# Patient Record
Sex: Male | Born: 1955
Health system: Southern US, Community
[De-identification: ages and names within clinical notes are randomized; demographics above are authoritative.]

## PROBLEM LIST (undated history)

## (undated) ENCOUNTER — Emergency Department (HOSPITAL_COMMUNITY): Admission: EM | Discharge status: Absent without leave | Payer: Self-pay

## (undated) DIAGNOSIS — I10 Essential (primary) hypertension: Secondary | ICD-10-CM

## (undated) DIAGNOSIS — E785 Hyperlipidemia, unspecified: Secondary | ICD-10-CM

## (undated) DIAGNOSIS — E119 Type 2 diabetes mellitus without complications: Secondary | ICD-10-CM

## (undated) DIAGNOSIS — Z9109 Other allergy status, other than to drugs and biological substances: Secondary | ICD-10-CM

## (undated) HISTORY — DX: Type 2 diabetes mellitus without complications: E11.9

## (undated) HISTORY — PX: EYE SURGERY: SHX253

## (undated) HISTORY — DX: Hyperlipidemia, unspecified: E78.5

---

## 2005-07-02 ENCOUNTER — Encounter: Admission: RE | Admit: 2005-07-02 | Discharge: 2005-09-30 | Payer: Self-pay | Admitting: "Endocrinology

## 2007-11-19 ENCOUNTER — Encounter: Admission: RE | Admit: 2007-11-19 | Discharge: 2007-11-19 | Payer: Self-pay | Admitting: Internal Medicine

## 2010-03-08 ENCOUNTER — Encounter: Payer: Self-pay | Admitting: Family Medicine

## 2010-03-08 ENCOUNTER — Emergency Department (HOSPITAL_COMMUNITY): Admission: EM | Admit: 2010-03-08 | Discharge: 2010-03-08 | Payer: Self-pay | Admitting: Emergency Medicine

## 2010-03-08 DIAGNOSIS — H401190 Primary open-angle glaucoma, unspecified eye, stage unspecified: Secondary | ICD-10-CM

## 2010-03-24 ENCOUNTER — Encounter: Payer: Self-pay | Admitting: *Deleted

## 2010-04-08 ENCOUNTER — Ambulatory Visit: Payer: Self-pay | Admitting: Family Medicine

## 2010-04-08 ENCOUNTER — Encounter (INDEPENDENT_AMBULATORY_CARE_PROVIDER_SITE_OTHER): Payer: Self-pay | Admitting: Family Medicine

## 2010-04-08 LAB — CONVERTED CEMR LAB
ALT: 29 units/L (ref 0–53)
Albumin: 4.1 g/dL (ref 3.5–5.2)
Basophils Relative: 0 % (ref 0–1)
CO2: 24 meq/L (ref 19–32)
Cholesterol: 197 mg/dL (ref 0–200)
Glucose, Bld: 116 mg/dL — ABNORMAL HIGH (ref 70–99)
Hemoglobin: 14.6 g/dL (ref 13.0–17.0)
LDL Cholesterol: 131 mg/dL — ABNORMAL HIGH (ref 0–99)
Lymphocytes Relative: 56 % — ABNORMAL HIGH (ref 12–46)
Lymphs Abs: 2.4 10*3/uL (ref 0.7–4.0)
MCHC: 34 g/dL (ref 30.0–36.0)
Monocytes Absolute: 0.4 10*3/uL (ref 0.1–1.0)
Monocytes Relative: 9 % (ref 3–12)
Neutro Abs: 1.4 10*3/uL — ABNORMAL LOW (ref 1.7–7.7)
Neutrophils Relative %: 33 % — ABNORMAL LOW (ref 43–77)
Potassium: 4.3 meq/L (ref 3.5–5.3)
RBC: 4.89 M/uL (ref 4.22–5.81)
Sodium: 139 meq/L (ref 135–145)
Total Bilirubin: 0.5 mg/dL (ref 0.3–1.2)
Total Protein: 6.8 g/dL (ref 6.0–8.3)
VLDL: 22 mg/dL (ref 0–40)
WBC: 4.3 10*3/uL (ref 4.0–10.5)

## 2010-04-23 ENCOUNTER — Ambulatory Visit: Payer: Self-pay | Admitting: Family Medicine

## 2010-06-12 ENCOUNTER — Ambulatory Visit: Payer: Self-pay | Admitting: Internal Medicine

## 2010-07-17 ENCOUNTER — Ambulatory Visit: Payer: Self-pay | Admitting: Internal Medicine

## 2010-07-17 ENCOUNTER — Encounter (INDEPENDENT_AMBULATORY_CARE_PROVIDER_SITE_OTHER): Payer: Self-pay | Admitting: Family Medicine

## 2010-07-17 LAB — CONVERTED CEMR LAB
Albumin: 4.2 g/dL (ref 3.5–5.2)
BUN: 15 mg/dL (ref 6–23)
Calcium: 9.1 mg/dL (ref 8.4–10.5)
Chloride: 104 meq/L (ref 96–112)
Cholesterol: 196 mg/dL (ref 0–200)
Creatinine, Ser: 0.89 mg/dL (ref 0.40–1.50)
Glucose, Bld: 111 mg/dL — ABNORMAL HIGH (ref 70–99)
HDL: 42 mg/dL (ref >39)
Hemoglobin: 14.8 g/dL (ref 13.0–17.0)
LDL Cholesterol: 130 mg/dL — ABNORMAL HIGH (ref 0–99)
Lymphs Abs: 2.3 10*3/uL (ref 0.7–4.0)
MCV: 89.8 fL (ref 78.0–100.0)
Monocytes Absolute: 0.4 10*3/uL (ref 0.1–1.0)
Monocytes Relative: 9 % (ref 3–12)
Neutro Abs: 1.4 10*3/uL — ABNORMAL LOW (ref 1.7–7.7)
Neutrophils Relative %: 34 % — ABNORMAL LOW (ref 43–77)
Potassium: 4.2 meq/L (ref 3.5–5.3)
RBC: 4.9 M/uL (ref 4.22–5.81)
Triglycerides: 121 mg/dL (ref <150)
WBC: 4.2 10*3/uL (ref 4.0–10.5)

## 2010-12-04 NOTE — Miscellaneous (Signed)
Summary: Health History Form  Clinical Lists Changes  Problems: Added new problem of GLAUCOMA, PRIMARY OPEN-ANGLE (ICD-365.11) Medications: Added new medication of TRAVATAN Z 0.004 % SOLN (TRAVOPROST) Once daily Observations: Added new observation of FAMILY HX: Negative for:  heart disease, cancer, diabetes, stroke, sudden deaty. (03/08/2010 16:13) Added new observation of SOCIAL HX: Lives alone in [place].  Married.  Postgraduate education.  Runs for fun.  Never smoked.  No EtOH/illicit drugs.  Exercises twice weekly:  walking, exercise machine. (03/08/2010 16:13) Added new observation of PAST SURG HX: Negative. (03/08/2010 16:13) Added new observation of PAST MED HX: Negative. (03/08/2010 16:13)      Past Medical History:    Negative.   Past History:  Past Medical History: Negative.  Past Surgical History: Negative.   Family History: Negative for:  heart disease, cancer, diabetes, stroke, sudden deaty.  Social History: Lives alone in [place].  Married.  Postgraduate education.  Runs for fun.  Never smoked.  No EtOH/illicit drugs.  Exercises twice weekly:  walking, exercise machine.

## 2010-12-04 NOTE — Miscellaneous (Signed)
Summary: Do Not Reschedule  Missed NP appt.  Per [facility] policy is not allowed to reschedule.  Dennison Nancy RN  Mar 24, 2010 1:53 PM

## 2012-03-01 ENCOUNTER — Encounter (HOSPITAL_COMMUNITY): Payer: Self-pay | Admitting: Emergency Medicine

## 2012-03-01 ENCOUNTER — Emergency Department (INDEPENDENT_AMBULATORY_CARE_PROVIDER_SITE_OTHER)
Admission: EM | Admit: 2012-03-01 | Discharge: 2012-03-01 | Disposition: A | Discharge status: Home / Self Care | Payer: Self-pay | Source: Home / Self Care | Attending: Emergency Medicine | Admitting: Emergency Medicine

## 2012-03-01 DIAGNOSIS — J309 Allergic rhinitis, unspecified: Secondary | ICD-10-CM

## 2012-03-01 HISTORY — DX: Other allergy status, other than to drugs and biological substances: Z91.09

## 2012-03-01 MED ORDER — FLUTICASONE PROPIONATE 50 MCG/ACT NA SUSP: 2.0000 | Freq: Every day | NASAL | Status: DC

## 2012-03-01 MED ORDER — AMOXICILLIN 500 MG PO CAPS: 1000.0000 mg | ORAL_CAPSULE | Freq: Three times a day (TID) | ORAL | Status: AC

## 2012-03-01 MED ORDER — GUAIFENESIN-CODEINE 100-10 MG/5ML PO SYRP: 10.0000 mL | ORAL_SOLUTION | Freq: Four times a day (QID) | ORAL | Status: AC | PRN

## 2012-03-01 MED ORDER — METHYLPREDNISOLONE ACETATE 80 MG/ML IJ SUSP
80.0000 mg | Freq: Once | INTRAMUSCULAR | Status: AC
  Administered 2012-03-01: 80 mg via INTRAMUSCULAR

## 2012-03-01 MED ORDER — CHLORPHENIRAMINE MALEATE 12 MG PO CP24: 1.0000 | ORAL_CAPSULE | Freq: Every day | ORAL | Status: DC

## 2012-03-01 MED ORDER — PREDNISONE 10 MG PO TABS: ORAL_TABLET | ORAL | Status: DC

## 2012-03-01 MED ORDER — ALBUTEROL SULFATE HFA 108 (90 BASE) MCG/ACT IN AERS: 1.0000 | INHALATION_SPRAY | Freq: Four times a day (QID) | RESPIRATORY_TRACT | Status: DC | PRN

## 2012-03-01 MED ORDER — METHYLPREDNISOLONE ACETATE 80 MG/ML IJ SUSP
INTRAMUSCULAR | Status: AC
  Filled 2012-03-01: qty 1

## 2012-03-01 NOTE — ED Provider Notes (Signed)
Chief Complaint  Patient presents with  . Allergies  . Cough    History of Present Illness:   The patient is a 56 year old male from [place] who has had a three-day history of cough productive of small amounts of yellow sputum, tightness in chest, chest congestion, sore throat, nasal congestion, sneezing, clear rhinorrhea, itchy, watery eyes. He denies any fever or chills. He has a history of allergies or sensitivities this country from [place]. He has been given benzonatate and Nasonex without any improvement. He denies any history of asthma.  Review of Systems:  Other than noted above, the patient denies any of the following symptoms. Systemic:  No fever, chills, sweats, fatigue, myalgias, headache, or anorexia. Eye:  No redness, itching, watering, pain or drainage. ENT:  No earache, ear congestion, nasal congestion, sneezing, nasal itching, rhinorrhea, sinus pressure, sinus pain, post nasal drip, or sore throat. Lungs:  No cough, sputum production, wheezing, shortness of breath, or chest pain. Skin:  No rash or itching.  PMFSH:  Past medical history, family history, social history, meds, and allergies were reviewed.  No history of asthma. No use of tobacco.  Physical Exam:   Vital signs:  BP 147/78  Pulse 100  Temp(Src) 97.9 F (36.6 C) (Oral)  Resp 20  SpO2 98% General:  Alert, in no distress. Eye:  No conjunctival injection or drainage. Lids were normal. ENT:  TMs and canals were normal, without erythema or inflammation.  Nasal mucosa was not congested with no drainage.  Mucous membranes were moist.  Pharynx was clear, without exudate or drainage.  There were no oral ulcerations or lesions. Neck:  Supple, no adenopathy, tenderness or mass. Lungs:  No respiratory distress.  Lungs were clear to auscultation, without wheezes, rales or rhonchi.  Breath sounds were clear and equal bilaterally. Heart:  Regular rhythm, without gallops, murmers or rubs. Skin:  Clear, warm, and dry, without  rash or lesions.  Course in Urgent Care Center:   He was given Depo-Medrol 80 mg IM and tolerated this well without any immediate side effects.  Assessment:  The encounter diagnosis was Allergic rhinitis.  Plan:   1.  The following meds were prescribed:   New Prescriptions   ALBUTEROL (PROVENTIL HFA;VENTOLIN HFA) 108 (90 BASE) MCG/ACT INHALER    Inhale 1-2 puffs into the lungs every 6 (six) hours as needed for wheezing.   AMOXICILLIN (AMOXIL) 500 MG CAPSULE    Take 2 capsules (1,000 mg total) by mouth 3 (three) times daily.   CHLORPHENIRAMINE MALEATE 12 MG CP24    Take 1 capsule (12 mg total) by mouth daily.   FLUTICASONE (FLONASE) 50 MCG/ACT NASAL SPRAY    Place 2 sprays into the nose daily.   GUAIFENESIN-CODEINE (GUIATUSS AC) 100-10 MG/5ML SYRUP    Take 10 mLs by mouth 4 (four) times daily as needed for cough.   PREDNISONE (DELTASONE) 10 MG TABLET    Take 4 tabs daily for 4 days, 3 tabs daily for 4 days, 2 tabs daily for 4 days, then 1 tab daily for 4 days.   2.  The patient was instructed in symptomatic care and handouts were given. 3.  The patient was told to return if becoming worse in any way, if no better in 3 or 4 days, and given some red flag symptoms that would indicate earlier return.  Follow up:  The patient was told to follow up with his primary care physician as [facility].    Reuben Likes, MD  03/01/12 1909 

## 2012-03-01 NOTE — Discharge Instructions (Signed)
Allergic Rhinitis  Allergic rhinitis is when the mucous membranes in the nose respond to allergens. Allergens are particles in the air that cause your body to have an allergic reaction. This causes you to release allergic antibodies. Through a chain of events, these eventually cause you to release histamine into the blood stream (hence the use of antihistamines). Although meant to be protective to the body, it is this release that causes your discomfort, such as frequent sneezing, congestion and an itchy runny nose.    CAUSES    The pollen allergens may come from grasses, trees, and weeds. This is seasonal allergic rhinitis, or "hay fever." Other allergens cause year-round allergic rhinitis (perennial allergic rhinitis) such as house dust mite allergen, pet dander and mold spores.    SYMPTOMS     Nasal stuffiness (congestion).   Runny, itchy nose with sneezing and tearing of the eyes.   There is often an itching of the mouth, eyes and ears.  It cannot be cured, but it can be controlled with medications.  DIAGNOSIS    If you are unable to determine the offending allergen, skin or blood testing may find it.  TREATMENT     Avoid the allergen.   Medications and allergy shots (immunotherapy) can help.   Hay fever may often be treated with antihistamines in pill or nasal spray forms. Antihistamines block the effects of histamine. There are over-the-counter medicines that may help with nasal congestion and swelling around the eyes. Check with your caregiver before taking or giving this medicine.  If the treatment above does not work, there are many new medications your caregiver can prescribe. Stronger medications may be used if initial measures are ineffective. Desensitizing injections can be used if medications and avoidance fails. Desensitization is when a patient is given ongoing shots until the body becomes less sensitive to the allergen. Make sure you follow up with your caregiver if problems continue.  SEEK  MEDICAL CARE IF:     You develop fever (more than 100.5 F (38.1 C).   You develop a cough that does not stop easily (persistent).   You have shortness of breath.   You start wheezing.   Symptoms interfere with normal daily activities.  Document Released: 07/15/2001 Document Revised: 10/09/2011 Document Reviewed: 01/24/2009  [employer] Patient Information 2012 [employer].

## 2012-03-01 NOTE — ED Notes (Signed)
PT HERE WITH ALLERGY SX THAT STARTED X 3 DYS AGO AFTER CUTTING GRASS.SX SORE THROAT,AND CONSTANT DRY COUGH NOTED THAT WORSENS AT NIGHT.PT IS TAKING OLD BENZONATATE AND NASONEX FOR SX BUT NO RELIEF.DENIES FEVER,CHILLS,N,V,D

## 2012-04-30 ENCOUNTER — Emergency Department (INDEPENDENT_AMBULATORY_CARE_PROVIDER_SITE_OTHER)
Admission: EM | Admit: 2012-04-30 | Discharge: 2012-04-30 | Disposition: A | Discharge status: Home / Self Care | Payer: Self-pay | Source: Home / Self Care | Attending: Family Medicine | Admitting: Family Medicine

## 2012-04-30 ENCOUNTER — Encounter (HOSPITAL_COMMUNITY): Payer: Self-pay

## 2012-04-30 DIAGNOSIS — M778 Other enthesopathies, not elsewhere classified: Secondary | ICD-10-CM

## 2012-04-30 DIAGNOSIS — M6580 Other synovitis and tenosynovitis, unspecified site: Secondary | ICD-10-CM

## 2012-04-30 MED ORDER — NAPROXEN 500 MG PO TABS: 500.0000 mg | ORAL_TABLET | Freq: Two times a day (BID) | ORAL | Status: DC

## 2012-04-30 NOTE — ED Provider Notes (Signed)
History     CSN: [id number]  Arrival date & time 04/30/12  1610   First MD Initiated Contact with Patient 04/30/12 1800      Chief Complaint  Patient presents with  . Arm Pain    (Consider location/radiation/quality/duration/timing/severity/associated sxs/prior treatment) HPI Comments: Pt denies injury, c/o pain L elbow with use of arm. States it hurts when he tries to pick things up, even light things and therefore it feels "weak"  Patient is a 56 y.o. male presenting with arm pain. The history is provided by the patient.  Arm Pain This is a new problem. Episode onset: a week ago. The problem occurs constantly. The problem has not changed since onset.The symptoms are aggravated by exertion. Nothing relieves the symptoms. He has tried rest for the symptoms. The treatment provided no relief.    Past Medical History  Diagnosis Date  . Pollen allergies     History reviewed. No pertinent past surgical history.  History reviewed. No pertinent family history.  History  Substance Use Topics  . Smoking status: Never Smoker   . Smokeless tobacco: Not on file  . Alcohol Use: No      Review of Systems  Constitutional: Negative for fever and chills.  Musculoskeletal:       Elbow pain  Skin: Negative for color change, rash and wound.  Neurological: Positive for weakness. Negative for numbness.    Allergies  Review of patient's allergies indicates no known allergies.  Home Medications   Current Outpatient Rx  Name Route Sig Dispense Refill  . ALBUTEROL SULFATE HFA 108 (90 BASE) MCG/ACT IN AERS Inhalation Inhale 1-2 puffs into the lungs every 6 (six) hours as needed for wheezing. 1 Inhaler 0  . CETIRIZINE HCL 10 MG PO TABS Oral Take 10 mg by mouth daily.    . CHLORPHENIRAMINE MALEATE ER 12 MG PO CP24 Oral Take 1 capsule (12 mg total) by mouth daily. 15 each 3  . FLUTICASONE PROPIONATE 50 MCG/ACT NA SUSP Nasal Place 2 sprays into the nose daily. 16 g 0  . NAPROXEN 500 MG PO  TABS Oral Take 1 tablet (500 mg total) by mouth 2 (two) times daily with a meal. 20 tablet 0  . PREDNISONE 10 MG PO TABS  Take 4 tabs daily for 4 days, 3 tabs daily for 4 days, 2 tabs daily for 4 days, then 1 tab daily for 4 days. 40 tablet 0    BP 142/86  Pulse 95  Temp 98.1 F (36.7 C) (Oral)  Resp 18  SpO2 97%  Physical Exam  Constitutional: He appears well-developed and well-nourished. No distress.  Pulmonary/Chest: Effort normal.  Musculoskeletal:       Left elbow: He exhibits normal range of motion, no swelling and no effusion. tenderness found.  Neurological: He has normal strength. No sensory deficit.       Strength normal BUE, elbow; sensation in BUE grossly intact.    Skin: Skin is warm, dry and intact. No rash noted.    ED Course  Procedures (including critical care time)  Labs Reviewed - No data to display No results found.   1. Tendonitis of elbow, left       MDM          Cathlyn Parsons, NP 04/30/12 1830

## 2012-04-30 NOTE — ED Notes (Signed)
Denies injury; c/o pain in left elbow, hard to hold anything; denies injury; has not taken anything for the pain

## 2012-04-30 NOTE — Discharge Instructions (Signed)
Tendinitis Tendinitis is swelling and inflammation of the tendons. Tendons are band-like tissues that connect muscle to bone. Tendinitis commonly occurs in the:   Shoulders (rotator cuff).   Heels (Achilles tendon).   Elbows (triceps tendon).  CAUSES Tendinitis is usually caused by overusing the tendon, muscles, and joints involved. When the tissue surrounding a tendon (synovium) becomes inflamed, it is called tenosynovitis. Tendinitis commonly develops in people whose jobs require repetitive motions. SYMPTOMS  Pain.   Tenderness.   Mild swelling.  DIAGNOSIS Tendinitis is usually diagnosed by physical exam. Your caregiver may also order X-rays or other imaging tests. TREATMENT Your caregiver may recommend certain medicines or exercises for your treatment. HOME CARE INSTRUCTIONS   Use a sling or splint for as long as directed by your caregiver until the pain decreases.   Put ice on the injured area.   Put ice in a plastic bag.   Place a towel between your skin and the bag.   Leave the ice on for 15 to 20 minutes, 3 to 4 times a day.   Avoid using the limb while the tendon is painful. Perform gentle range of motion exercises only as directed by your caregiver. Stop exercises if pain or discomfort increase, unless directed otherwise by your caregiver.   Only take over-the-counter or prescription medicines for pain, discomfort, or fever as directed by your caregiver.  SEEK MEDICAL CARE IF:   Your pain and swelling increase.   You develop new, unexplained symptoms, especially increased numbness in the hands.  MAKE SURE YOU:   Understand these instructions.   Will watch your condition.   Will get help right away if you are not doing well or get worse.  Document Released: 10/17/2000 Document Revised: 10/09/2011 Document Reviewed: 01/06/2011 [facility] Patient Information 2012 [place], [place].Tendinitis Tendinitis is swelling and inflammation of the tendons. Tendons are  band-like tissues that connect muscle to bone. Tendinitis commonly occurs in the:   Shoulders (rotator cuff).   Heels (Achilles tendon).   Elbows (triceps tendon).  CAUSES Tendinitis is usually caused by overusing the tendon, muscles, and joints involved. When the tissue surrounding a tendon (synovium) becomes inflamed, it is called tenosynovitis. Tendinitis commonly develops in people whose jobs require repetitive motions. SYMPTOMS  Pain.   Tenderness.   Mild swelling.  DIAGNOSIS Tendinitis is usually diagnosed by physical exam. Your caregiver may also order X-rays or other imaging tests. TREATMENT Your caregiver may recommend certain medicines or exercises for your treatment. HOME CARE INSTRUCTIONS   Use a sling or splint for as long as directed by your caregiver until the pain decreases.   Put ice on the injured area.   Put ice in a plastic bag.   Place a towel between your skin and the bag.   Leave the ice on for 15 to 20 minutes, 3 to 4 times a day.   Avoid using the limb while the tendon is painful. Perform gentle range of motion exercises only as directed by your caregiver. Stop exercises if pain or discomfort increase, unless directed otherwise by your caregiver.   Only take over-the-counter or prescription medicines for pain, discomfort, or fever as directed by your caregiver.  SEEK MEDICAL CARE IF:   Your pain and swelling increase.   You develop new, unexplained symptoms, especially increased numbness in the hands.  MAKE SURE YOU:   Understand these instructions.   Will watch your condition.   Will get help right away if you are not doing well or get worse.  Document Released: 10/17/2000 Document Revised: 10/09/2011 Document Reviewed: 01/06/2011 [facility] Patient Information 2012 [place], [place].

## 2012-05-01 NOTE — ED Provider Notes (Signed)
Medical screening examination/treatment/procedure(s) were performed by non-physician practitioner and as supervising physician I was immediately available for consultation/collaboration.   [facility]; MD   Sharin Grave, MD 05/01/12 1255

## 2012-05-21 ENCOUNTER — Emergency Department (INDEPENDENT_AMBULATORY_CARE_PROVIDER_SITE_OTHER)
Admission: EM | Admit: 2012-05-21 | Discharge: 2012-05-21 | Disposition: A | Discharge status: Home / Self Care | Payer: Self-pay | Source: Home / Self Care | Attending: Emergency Medicine | Admitting: Emergency Medicine

## 2012-05-21 ENCOUNTER — Encounter (HOSPITAL_COMMUNITY): Payer: Self-pay | Admitting: Emergency Medicine

## 2012-05-21 DIAGNOSIS — M778 Other enthesopathies, not elsewhere classified: Secondary | ICD-10-CM

## 2012-05-21 DIAGNOSIS — M6580 Other synovitis and tenosynovitis, unspecified site: Secondary | ICD-10-CM

## 2012-05-21 MED ORDER — MELOXICAM 7.5 MG PO TABS: 7.5000 mg | ORAL_TABLET | Freq: Every day | ORAL | Status: AC

## 2012-05-21 NOTE — ED Notes (Signed)
Pt c/o left arm pain that started 3 weeks ago and was tx here and had pain medication prescribed that helps some but continues to hurt. Pt stated that he sometimes has numbness to left arm.

## 2012-05-25 NOTE — ED Provider Notes (Signed)
Medical screening examination/treatment/procedure(s) were performed by non-physician practitioner and as supervising physician I was immediately available for consultation/collaboration.  Raynald Blend, MD 05/25/12 2024

## 2012-05-25 NOTE — ED Provider Notes (Signed)
History     CSN: [id number]  Arrival date & time 05/21/12  1540   First MD Initiated Contact with Patient 05/21/12 1733      Chief Complaint  Patient presents with  . Extremity Pain    (Consider location/radiation/quality/duration/timing/severity/associated sxs/prior treatment) HPI Comments: Pt seen here by me 6/29, dx tendonitis in L elbow, given naproxen as tx.  Pt reports pain relieved while taking naproxen, but returned when naproxen ran out.  "weakness" when attempting to lift objects with L arm did not improve with tx and is unchanged.   Patient is a 56 y.o. male presenting with extremity pain. The history is provided by the patient.  Extremity Pain This is a new problem. Episode onset: 1.5 months ago. The problem occurs daily. The problem has not changed since onset.Exacerbated by: using arm. The symptoms are relieved by NSAIDs. Treatments tried: naproxen. Improvement on treatment: significant transient relief.    Past Medical History  Diagnosis Date  . Pollen allergies     History reviewed. No pertinent past surgical history.  History reviewed. No pertinent family history.  History  Substance Use Topics  . Smoking status: Never Smoker   . Smokeless tobacco: Not on file  . Alcohol Use: No      Review of Systems  Constitutional: Negative for fever and chills.  Musculoskeletal:       Elbow pain and L arm weakness  Skin: Negative for color change, rash and wound.  Neurological: Positive for weakness. Negative for numbness.    Allergies  Review of patient's allergies indicates no known allergies.  Home Medications   Current Outpatient Rx  Name Route Sig Dispense Refill  . ALBUTEROL SULFATE HFA 108 (90 BASE) MCG/ACT IN AERS Inhalation Inhale 1-2 puffs into the lungs every 6 (six) hours as needed for wheezing. 1 Inhaler 0  . CETIRIZINE HCL 10 MG PO TABS Oral Take 10 mg by mouth daily.    . CHLORPHENIRAMINE MALEATE ER 12 MG PO CP24 Oral Take 1 capsule (12 mg  total) by mouth daily. 15 each 3  . FLUTICASONE PROPIONATE 50 MCG/ACT NA SUSP Nasal Place 2 sprays into the nose daily. 16 g 0  . MELOXICAM 7.5 MG PO TABS Oral Take 1 tablet (7.5 mg total) by mouth daily. 21 tablet 0    BP 127/80  Pulse 80  Temp 98.1 F (36.7 C)  Resp 18  SpO2 98%  Physical Exam  Constitutional: He is oriented to person, place, and time. He appears well-developed and well-nourished. No distress.  Pulmonary/Chest: Effort normal.  Musculoskeletal:       Left elbow: He exhibits normal range of motion, no swelling and no deformity. tenderness found. Lateral epicondyle tenderness noted.       Left wrist: Normal.       Left hand: He exhibits normal range of motion, no tenderness, normal two-point discrimination, no deformity and no swelling. normal sensation noted.       Grip of L hand is slightly weaker compared to R.   Neurological: He is alert and oriented to person, place, and time. No sensory deficit.  Skin: Skin is warm, dry and intact.    ED Course  Procedures (including critical care time)  Labs Reviewed - No data to display No results found.   1. Elbow tendonitis       MDM  Discussed with Dr. Ladon Applebaum.  Pt given and RN demonstrated use of counterforce brace for L elbow tendinitis.  Pt to f/u with ortho  if no improvement in sx after this tx.        Cathlyn Parsons, NP 05/25/12 [id number]

## 2012-06-22 ENCOUNTER — Emergency Department (HOSPITAL_COMMUNITY)
Admission: EM | Admit: 2012-06-22 | Discharge: 2012-06-22 | Disposition: A | Discharge status: Home / Self Care | Payer: Self-pay | Source: Home / Self Care | Attending: Emergency Medicine | Admitting: Emergency Medicine

## 2012-06-22 ENCOUNTER — Encounter (HOSPITAL_COMMUNITY): Payer: Self-pay | Admitting: *Deleted

## 2012-06-22 ENCOUNTER — Emergency Department (INDEPENDENT_AMBULATORY_CARE_PROVIDER_SITE_OTHER): Payer: Self-pay

## 2012-06-22 DIAGNOSIS — M7710 Lateral epicondylitis, unspecified elbow: Secondary | ICD-10-CM

## 2012-06-22 MED ORDER — METHYLPREDNISOLONE ACETATE 40 MG/ML IJ SUSP
INTRAMUSCULAR | Status: AC
  Filled 2012-06-22: qty 5

## 2012-06-22 NOTE — ED Provider Notes (Signed)
Chief Complaint  Patient presents with  . Arm Problem    History of Present Illness:   The patient is a 56 year old male with a one-month history of left lateral epicondyle pain and some swelling. It hurts him to lift. The elbow has a full range of motion with no pain. He denies any trauma or injury. He does do a lot of lifting and uses arm quite a bit. He works as an Optician, dispensing. He does not play any recreational sports. He was here a couple weeks ago and was given anti-inflammatories but feels no better.  Review of Systems:  Other than noted above, the patient denies any of the following symptoms: Systemic:  No fevers, chills, sweats, or aches.  No fatigue or tiredness. Musculoskeletal:  No joint pain, arthritis, bursitis, swelling, back pain, or neck pain. Neurological:  No muscular weakness, paresthesias, headache, or trouble with speech or coordination.  No dizziness.  PMFSH:  Past medical history, family history, social history, meds, and allergies were reviewed.  Physical Exam:   Vital signs:  BP 143/80  Pulse 72  Temp 98.6 F (37 C) (Oral)  Resp 18  SpO2 99% Gen:  Alert and oriented times 3.  In no distress. Musculoskeletal: There is pain to palpation over the lateral epicondyles. No swelling. Elbow joint had a full range of motion. No pain to palpation over the olecranon, medial epicondyle, or the antecubital fossa. There was pain on resisted dorsiflexion of the wrist. Otherwise, all joints had a full a ROM with no swelling, bruising or deformity.  No edema, pulses full. Extremities were warm and pink.  Capillary refill was brisk.  Skin:  Clear, warm and dry.  No rash. Neuro:  Alert and oriented times 3.  Muscle strength was normal.  Sensation was intact to light touch.   Radiology:  Dg Elbow Complete Left  06/22/2012  *RADIOLOGY REPORT*  Clinical Data: Elbow pain, no injury  LEFT ELBOW - COMPLETE 3+ VIEW  Comparison: None.  Findings: Normal  alignment and no fracture.  No significant joint space narrowing.  Minimal spurring of the medial epicondyle. Minimal spurring of the coronoid process of the ulna.  IMPRESSION: Mild degenerative change.  No acute abnormality.   Original Report Authenticated By: Camelia Phenes, M.D.    Procedure Note:  Verbal informed consent was obtained from the patient.  Risks and benefits were outlined with the patient.  Patient understands and accepts these risks.  Identity of the patient was confirmed verbally and by armband.    Procedure was performed as followed:  The lateral epicondyles was prepped with Betadine and alcohol and anesthetized with ethyl chloride spray. Using a 1/2 inch 27-gauge needle, 2 cc of Marcaine and 1 cc of Depo-Medrol 40 mg strength were injected in a fanlike distribution.  Patient tolerated the procedure well without any immediate complications.  Assessment:  The encounter diagnosis was Lateral epicondylitis.  Plan:   1.  The following meds were prescribed:   New Prescriptions   No medications on file   2.  The patient was instructed in symptomatic care, including rest and activity, elevation, application of ice and compression.  Appropriate handouts were given. 3.  The patient was told to return if becoming worse in any way, if no better in 3 or 4 days, and given some red flag symptoms that would indicate earlier return.   4.  The patient was told to follow up here if no better in 2  weeks.   Reuben Likes, MD 06/22/12 2028

## 2012-06-22 NOTE — ED Notes (Signed)
Pt  Reports  Pain  l  Arm        Worse  On  Movement  He  Reports  The  Symptoms  X  1  Month   Seen  ucc  3 weeks  Ago        Rx  meds  -  -  Pt  Reports  Still  Has  Symptoms  -  Pt  denys  Any  specefic injury         Yet  Uses  His  Arm a  Lot at  Work

## 2013-05-17 ENCOUNTER — Ambulatory Visit: Payer: Self-pay

## 2013-08-25 ENCOUNTER — Ambulatory Visit: Payer: Self-pay | Admitting: Internal Medicine

## 2013-09-16 ENCOUNTER — Encounter: Payer: Self-pay | Admitting: Internal Medicine

## 2013-09-16 ENCOUNTER — Ambulatory Visit: Payer: Self-pay | Attending: Internal Medicine | Admitting: Internal Medicine

## 2013-09-16 VITALS — BP 194/103 | HR 87 | Temp 98.1°F | Resp 16 | Ht 66.0 in | Wt 179.0 lb

## 2013-09-16 DIAGNOSIS — I10 Essential (primary) hypertension: Secondary | ICD-10-CM | POA: Insufficient documentation

## 2013-09-16 LAB — COMPLETE METABOLIC PANEL WITH GFR
AST: 18 U/L (ref 0–37)
Albumin: 4.2 g/dL (ref 3.5–5.2)
Alkaline Phosphatase: 68 U/L (ref 39–117)
Potassium: 3.9 mEq/L (ref 3.5–5.3)
Sodium: 141 mEq/L (ref 135–145)
Total Bilirubin: 0.3 mg/dL (ref 0.3–1.2)
Total Protein: 7.1 g/dL (ref 6.0–8.3)

## 2013-09-16 LAB — CBC
HCT: 42.8 % (ref 39.0–52.0)
Hemoglobin: 14.9 g/dL (ref 13.0–17.0)
MCHC: 34.8 g/dL (ref 30.0–36.0)
MCV: 88.8 fL (ref 78.0–100.0)
RDW: 13.9 % (ref 11.5–15.5)
WBC: 4.3 10*3/uL (ref 4.0–10.5)

## 2013-09-16 MED ORDER — TRAVOPROST 0.004 % OP SOLN: 1.0000 [drp] | Freq: Every day | OPHTHALMIC | Status: DC

## 2013-09-16 MED ORDER — HYDRALAZINE HCL 25 MG PO TABS: 25.0000 mg | ORAL_TABLET | Freq: Three times a day (TID) | ORAL | Status: DC

## 2013-09-16 MED ORDER — CLONIDINE HCL 0.1 MG PO TABS
0.2000 mg | ORAL_TABLET | Freq: Once | ORAL | Status: AC
  Administered 2013-09-16: 0.2 mg via ORAL

## 2013-09-16 MED ORDER — CARVEDILOL 6.25 MG PO TABS: 6.2500 mg | ORAL_TABLET | Freq: Two times a day (BID) | ORAL | Status: DC

## 2013-09-16 NOTE — Progress Notes (Unsigned)
Patient ID: Ronald Massey, male   DOB: 1956-06-28, 57 y.o.   MRN: [id number]  Patient Demographics  Ronald Massey, is a 57 y.o. male  WGN:[id number]  HQI:[id number]  DOB - 1956/03/06  Chief Complaint  Patient presents with  . Establish Care  . Glaucoma        Subjective:   [name] with History of HTN, open-angle glaucoma is here for establishing care, he currently has no subjective complaints, denies any history of diabetes mellitus, heart problems or lung problems. He does not smoke or drink alcohol. He has an eye doctor whom he follows with. He does not remember his eye medication name correctly but he has 2 bottles left at home.  Denies any subjective complaints except as above, no active headache, no chest abdominal pain at this time, not short of breath. No focal weakness which is new.    Objective:    Patient Active Problem List   Diagnosis Date Noted  . HTN (hypertension) 09/16/2013  . GLAUCOMA, PRIMARY OPEN-ANGLE 03/08/2010     Filed Vitals:   09/16/13 1619  BP: 194/103  Pulse: 87  Temp: 98.1 F (36.7 C)  TempSrc: Oral  Resp: 16  Height: 5\' 6"  (1.676 m)  Weight: 179 lb (81.194 kg)  SpO2: 98%     Exam   Awake Alert, Oriented X 3, No new F.N deficits, Normal affect [place].AT,PERRAL Supple Neck,No JVD, No cervical lymphadenopathy appriciated.  Symmetrical Chest wall movement, Good air movement bilaterally, CTAB RRR,No Gallops,Rubs or new Murmurs, No Parasternal Heave +ve B.Sounds, Abd Soft, Non tender, No organomegaly appriciated, No rebound - guarding or rigidity. No Cyanosis, Clubbing or edema, No new Rash or bruise      Data Review   Lab Results  Component Value Date   WBC 4.2 07/17/2010   HGB 14.8 07/17/2010   HCT 44.0 07/17/2010   MCV 89.8 07/17/2010   PLT 165 07/17/2010      Chemistry      Component Value Date/Time   NA 139 07/17/2010 2035   K 4.2 07/17/2010 2035   CL 104 07/17/2010 2035   CO2 26 07/17/2010 2035   BUN 15 07/17/2010 2035   CREATININE 0.89 07/17/2010 2035      Component Value Date/Time   CALCIUM 9.1 07/17/2010 2035   ALKPHOS 89 07/17/2010 2035   AST 18 07/17/2010 2035   ALT 24 07/17/2010 2035   BILITOT 0.7 07/17/2010 2035       No results found for this basename: HGBA1C    Lab Results  Component Value Date   CHOL 196 07/17/2010   HDL 42 07/17/2010   LDLCALC 130* 07/17/2010   TRIG 121 07/17/2010   CHOLHDL 4.7 Ratio 07/17/2010    No results found for this basename: TSH    Lab Results  Component Value Date   PSA 0.46 04/08/2010      Prior to Admission medications   Medication Sig Start Date End Date Taking? Authorizing Provider  albuterol (PROVENTIL HFA;VENTOLIN HFA) 108 (90 BASE) MCG/ACT inhaler Inhale 1-2 puffs into the lungs every 6 (six) hours as needed for wheezing. 03/01/12 03/01/13  Reuben Likes, MD  carvedilol (COREG) 6.25 MG tablet Take 1 tablet (6.25 mg total) by mouth 2 (two) times daily with a meal. 09/16/13   Leroy Sea, MD  cetirizine (ZYRTEC) 10 MG tablet Take 10 mg by mouth daily.    Historical Provider, MD  Chlorpheniramine Maleate 12 MG CP24 Take 1 capsule (12 mg total) by mouth  daily. 03/01/12   Reuben Likes, MD  fluticasone [facility]) 50 MCG/ACT nasal spray Place 2 sprays into the nose daily. 03/01/12 03/01/13  Reuben Likes, MD  hydrALAZINE (APRESOLINE) 25 MG tablet Take 1 tablet (25 mg total) by mouth 3 (three) times daily. 09/16/13   Leroy Sea, MD     Assessment & Plan    Hypertension. Blood pressure isn't well controlled. Patient is completely symptomatic, likely has had elevated blood pressure for a while, will give him 0.2 mg of Catapres her thereafter place him on comminution of Coreg and hydralazine. Get him back in 2 weeks to monitor blood pressure and adjust medications.   Open-angle glaucoma. He follows with the ophthalmologist on a regular basis, currently is symptom-free, no acute vision problems her eye pressure or pain. He does not remember his eyedrop  medication name but likely is travoprost, we will get the name and dosage of his medication and call it in to the pharmacy if needed. He was advised to follow with his of some of his regular basis.     Routine health maintenance.  Screening labs which include CBC, CMP, TSH, A1c and PSA have been ordered   GI referral for screening colonoscopy made   Graciella Arment K M.D on 09/16/2013 at 4:26 PM    Patient was given clear instructions to go to ER or return to the clinic if symptoms don't improve, worsen or new problems develop. Patient verbalized understanding. Patient was told to call to get lab results if hasn't heard anything in the next week.

## 2013-09-16 NOTE — Progress Notes (Unsigned)
Pt here requesting prescribed eye drop refill for glaucoma Pt is being seen by opthalmology Bp elevated- denies h/a or blurred vision

## 2013-09-16 NOTE — Progress Notes (Unsigned)
Pt here to establish care for glaucoma and needing prescribed eye drops Pt has ophthalmologist BP elevated 194/103 Denies h/a or pain

## 2013-10-26 ENCOUNTER — Other Ambulatory Visit: Payer: Self-pay | Admitting: Internal Medicine

## 2013-10-26 MED ORDER — TRAVOPROST 0.004 % OP SOLN: 1.0000 [drp] | Freq: Every day | OPHTHALMIC | Status: DC

## 2013-11-18 ENCOUNTER — Encounter: Payer: Self-pay | Admitting: Internal Medicine

## 2014-11-22 ENCOUNTER — Ambulatory Visit: Payer: Self-pay | Attending: Internal Medicine

## 2014-12-06 ENCOUNTER — Other Ambulatory Visit: Payer: Self-pay | Admitting: Internal Medicine

## 2014-12-07 ENCOUNTER — Ambulatory Visit: Payer: No Typology Code available for payment source | Admitting: Internal Medicine

## 2014-12-19 ENCOUNTER — Ambulatory Visit: Payer: No Typology Code available for payment source | Admitting: Internal Medicine

## 2014-12-20 ENCOUNTER — Ambulatory Visit: Payer: No Typology Code available for payment source | Admitting: Internal Medicine

## 2014-12-25 ENCOUNTER — Ambulatory Visit: Payer: No Typology Code available for payment source | Attending: Internal Medicine | Admitting: Internal Medicine

## 2014-12-25 ENCOUNTER — Encounter: Payer: Self-pay | Admitting: Internal Medicine

## 2014-12-25 VITALS — BP 170/90 | HR 99 | Temp 98.6°F | Resp 17 | Wt 176.6 lb

## 2014-12-25 DIAGNOSIS — K029 Dental caries, unspecified: Secondary | ICD-10-CM

## 2014-12-25 DIAGNOSIS — R030 Elevated blood-pressure reading, without diagnosis of hypertension: Secondary | ICD-10-CM

## 2014-12-25 DIAGNOSIS — I10 Essential (primary) hypertension: Secondary | ICD-10-CM | POA: Insufficient documentation

## 2014-12-25 DIAGNOSIS — Z7951 Long term (current) use of inhaled steroids: Secondary | ICD-10-CM | POA: Insufficient documentation

## 2014-12-25 DIAGNOSIS — IMO0001 Reserved for inherently not codable concepts without codable children: Secondary | ICD-10-CM

## 2014-12-25 DIAGNOSIS — H409 Unspecified glaucoma: Secondary | ICD-10-CM | POA: Insufficient documentation

## 2014-12-25 DIAGNOSIS — Z23 Encounter for immunization: Secondary | ICD-10-CM | POA: Insufficient documentation

## 2014-12-25 HISTORY — DX: Dental caries, unspecified: K02.9

## 2014-12-25 MED ORDER — HYDRALAZINE HCL 25 MG PO TABS: 25.0000 mg | ORAL_TABLET | Freq: Three times a day (TID) | ORAL | Status: DC

## 2014-12-25 MED ORDER — TRAVOPROST 0.004 % OP SOLN: 1.0000 [drp] | Freq: Every day | OPHTHALMIC | Status: DC

## 2014-12-25 MED ORDER — CARVEDILOL 6.25 MG PO TABS: 6.2500 mg | ORAL_TABLET | Freq: Two times a day (BID) | ORAL | Status: DC

## 2014-12-25 MED ORDER — CLONIDINE HCL 0.1 MG PO TABS
0.2000 mg | ORAL_TABLET | Freq: Once | ORAL | Status: AC
  Administered 2014-12-25: 0.2 mg via ORAL

## 2014-12-25 NOTE — Patient Instructions (Signed)
DASH Eating Plan °DASH stands for "Dietary Approaches to Stop Hypertension." The DASH eating plan is a healthy eating plan that has been shown to reduce high blood pressure (hypertension). Additional health benefits may include reducing the risk of type 2 diabetes mellitus, heart disease, and stroke. The DASH eating plan may also help with weight loss. °WHAT DO I NEED TO KNOW ABOUT THE DASH EATING PLAN? °For the DASH eating plan, you will follow these general guidelines: °· Choose foods with a percent daily value for sodium of less than 5% (as listed on the food label). °· Use salt-free seasonings or herbs instead of table salt or sea salt. °· Check with your health care provider or pharmacist before using salt substitutes. °· Eat lower-sodium products, often labeled as "lower sodium" or "no salt added." °· Eat fresh foods. °· Eat more vegetables, fruits, and low-fat dairy products. °· Choose whole grains. Look for the word "whole" as the first word in the ingredient list. °· Choose fish and skinless chicken or turkey more often than red meat. Limit fish, poultry, and meat to 6 oz (170 g) each day. °· Limit sweets, desserts, sugars, and sugary drinks. °· Choose heart-healthy fats. °· Limit cheese to 1 oz (28 g) per day. °· Eat more home-cooked food and less restaurant, buffet, and fast food. °· Limit fried foods. °· Cook foods using methods other than frying. °· Limit canned vegetables. If you do use them, rinse them well to decrease the sodium. °· When eating at a restaurant, ask that your food be prepared with less salt, or no salt if possible. °WHAT FOODS CAN I EAT? °Seek help from a dietitian for individual calorie needs. °Grains °Whole grain or whole wheat bread. Brown rice. Whole grain or whole wheat pasta. Quinoa, bulgur, and whole grain cereals. Low-sodium cereals. Corn or whole wheat flour tortillas. Whole grain cornbread. Whole grain crackers. Low-sodium crackers. °Vegetables °Fresh or frozen vegetables  (raw, steamed, roasted, or grilled). Low-sodium or reduced-sodium tomato and vegetable juices. Low-sodium or reduced-sodium tomato sauce and paste. Low-sodium or reduced-sodium canned vegetables.  °Fruits °All fresh, canned (in natural juice), or frozen fruits. °Meat and Other Protein Products °Ground beef (85% or leaner), grass-fed beef, or beef trimmed of fat. Skinless chicken or turkey. Ground chicken or turkey. Pork trimmed of fat. All fish and seafood. Eggs. Dried beans, peas, or lentils. Unsalted nuts and seeds. Unsalted canned beans. °Dairy °Low-fat dairy products, such as skim or 1% milk, 2% or reduced-fat cheeses, low-fat ricotta or cottage cheese, or plain low-fat yogurt. Low-sodium or reduced-sodium cheeses. °Fats and Oils °Tub margarines without trans fats. Light or reduced-fat mayonnaise and salad dressings (reduced sodium). Avocado. Safflower, olive, or canola oils. Natural peanut or almond butter. °Other °Unsalted popcorn and pretzels. °The items listed above may not be a complete list of recommended foods or beverages. Contact your dietitian for more options. °WHAT FOODS ARE NOT RECOMMENDED? °Grains °White bread. White pasta. White rice. Refined cornbread. Bagels and croissants. Crackers that contain trans fat. °Vegetables °Creamed or fried vegetables. Vegetables in a cheese sauce. Regular canned vegetables. Regular canned tomato sauce and paste. Regular tomato and vegetable juices. °Fruits °Dried fruits. Canned fruit in light or heavy syrup. Fruit juice. °Meat and Other Protein Products °Fatty cuts of meat. Ribs, chicken wings, bacon, sausage, bologna, salami, chitterlings, fatback, hot dogs, bratwurst, and packaged luncheon meats. Salted nuts and seeds. Canned beans with salt. °Dairy °Whole or 2% milk, cream, half-and-half, and cream cheese. Whole-fat or sweetened yogurt. Full-fat   cheeses or blue cheese. Nondairy creamers and whipped toppings. Processed cheese, cheese spreads, or cheese  curds. °Condiments °Onion and garlic salt, seasoned salt, table salt, and sea salt. Canned and packaged gravies. Worcestershire sauce. Tartar sauce. Barbecue sauce. Teriyaki sauce. Soy sauce, including reduced sodium. Steak sauce. Fish sauce. Oyster sauce. Cocktail sauce. Horseradish. Ketchup and mustard. Meat flavorings and tenderizers. Bouillon cubes. Hot sauce. Tabasco sauce. Marinades. Taco seasonings. Relishes. °Fats and Oils °Butter, stick margarine, lard, shortening, ghee, and bacon fat. Coconut, palm kernel, or palm oils. Regular salad dressings. °Other °Pickles and olives. Salted popcorn and pretzels. °The items listed above may not be a complete list of foods and beverages to avoid. Contact your dietitian for more information. °WHERE CAN I FIND MORE INFORMATION? °National Heart, Lung, and [employer] Institute: www.nhlbi.nih.gov/health/health-topics/topics/dash/ °Document Released: 10/09/2011 Document Revised: 03/06/2014 Document Reviewed: 08/24/2013 °ExitCare® Patient Information ©2015 [employer]. This information is not intended to replace advice given to you by your health care provider. Make sure you discuss any questions you have with your health care provider. ° °

## 2014-12-25 NOTE — Progress Notes (Signed)
MRN: [id number] Name: Ronald Massey  Sex: male Age: 59 y.o. DOB: 08/28/1956  Allergies: Review of patient's allergies indicates no known allergies.  Chief Complaint  Patient presents with  . Follow-up    HPI: Patient is 59 y.o. male who has to of hypertension, glaucoma, patient was seen in this office in 2014, apparently was on Coreg and hydralazine as per patient he has not been taking these medications, today's blood pressure is elevated, was given clonidine, repeat manual blood pressure is 170/90, currently denies any headache dizziness chest and shortness of breath, is requesting refill on his eye drops and has not seen an ophthalmologist and is requesting referral, patient also has several dental cavities and is requesting referral to see a dentist.  Past Medical History  Diagnosis Date  . Pollen allergies     History reviewed. No pertinent past surgical history.    Medication List       This list is accurate as of: 12/25/14  5:23 PM.  Always use your most recent med list.               albuterol 108 (90 BASE) MCG/ACT inhaler  Commonly known as:  PROVENTIL HFA;VENTOLIN HFA  Inhale 1-2 puffs into the lungs every 6 (six) hours as needed for wheezing.     carvedilol 6.25 MG tablet  Commonly known as:  COREG  Take 1 tablet (6.25 mg total) by mouth 2 (two) times daily with a meal.     cetirizine 10 MG tablet  Commonly known as:  ZYRTEC  Take 10 mg by mouth daily.     Chlorpheniramine Maleate 12 MG Cp24  Take 1 capsule (12 mg total) by mouth daily.     fluticasone 50 MCG/ACT nasal spray  Commonly known as:  FLONASE  Place 2 sprays into the nose daily.     hydrALAZINE 25 MG tablet  Commonly known as:  APRESOLINE  Take 1 tablet (25 mg total) by mouth 3 (three) times daily.     travoprost (benzalkonium) 0.004 % ophthalmic solution  Commonly known as:  TRAVATAN  Place 1 drop into both eyes at bedtime.        Meds ordered this encounter  Medications  .  cloNIDine (CATAPRES) tablet 0.2 mg    Sig:   . carvedilol (COREG) 6.25 MG tablet    Sig: Take 1 tablet (6.25 mg total) by mouth 2 (two) times daily with a meal.    Dispense:  60 tablet    Refill:  3  . hydrALAZINE (APRESOLINE) 25 MG tablet    Sig: Take 1 tablet (25 mg total) by mouth 3 (three) times daily.    Dispense:  90 tablet    Refill:  3  . travoprost, benzalkonium, (TRAVATAN) 0.004 % ophthalmic solution    Sig: Place 1 drop into both eyes at bedtime.    Dispense:  15 mL    Refill:  1 year    Immunization History  Administered Date(s) Administered  . Influenza Split 09/16/2013  . Influenza,inj,Quad PF,36+ Mos 12/25/2014    History reviewed. No pertinent family history.  History  Substance Use Topics  . Smoking status: Never Smoker   . Smokeless tobacco: Not on file  . Alcohol Use: No    Review of Systems   As noted in HPI  Filed Vitals:   12/25/14 1649  BP: 170/90  Pulse:   Temp:   Resp:     Physical Exam  Physical Exam  Constitutional:  No distress.  HENT:  Dental cavities   Eyes: EOM are normal. Pupils are equal, round, and reactive to light.  Cardiovascular: Normal rate and regular rhythm.   Pulmonary/Chest: Breath sounds normal. No respiratory distress. He has no wheezes. He has no rales.  Musculoskeletal: He exhibits no edema.    CBC    Component Value Date/Time   WBC 4.3 09/16/2013 1623   RBC 4.82 09/16/2013 1623   HGB 14.9 09/16/2013 1623   HCT 42.8 09/16/2013 1623   PLT 189 09/16/2013 1623   MCV 88.8 09/16/2013 1623   LYMPHSABS 2.3 07/17/2010 2035   MONOABS 0.4 07/17/2010 2035   EOSABS 0.1 07/17/2010 2035   BASOSABS 0.0 07/17/2010 2035    CMP     Component Value Date/Time   NA 141 09/16/2013 1623   K 3.9 09/16/2013 1623   CL 104 09/16/2013 1623   CO2 30 09/16/2013 1623   GLUCOSE 124* 09/16/2013 1623   BUN 18 09/16/2013 1623   CREATININE 0.92 09/16/2013 1623   CREATININE 0.89 07/17/2010 2035   CALCIUM 9.4 09/16/2013 1623    PROT 7.1 09/16/2013 1623   ALBUMIN 4.2 09/16/2013 1623   AST 18 09/16/2013 1623   ALT 22 09/16/2013 1623   ALKPHOS 68 09/16/2013 1623   BILITOT 0.3 09/16/2013 1623   GFRNONAA >89 09/16/2013 1623   GFRAA >89 09/16/2013 1623    Lab Results  Component Value Date/Time   CHOL 196 07/17/2010 08:35 PM    No components found for: HGA1C  Lab Results  Component Value Date/Time   AST 18 09/16/2013 04:23 PM    Assessment and Plan  Elevated blood pressure - Plan: cloNIDine (CATAPRES) tablet 0.2 mg  Essential hypertension - Plan: I have advised patient for DASH diet and compliance with the medications, resume back on carvedilol (COREG) 6.25 MG tablet, hydrALAZINE (APRESOLINE) 25 MG tablet, patient will come back in 2 weeks for nurse visit BP check.  Glaucoma - Plan: travoprost, benzalkonium, (TRAVATAN) 0.004 % ophthalmic solution, Ambulatory referral to Ophthalmology  Dental cavities - Plan: Ambulatory referral to Dentistry  Health Maintenance  -Vaccinations:  Flu shot today   Return in about 3 months (around 03/25/2015) for hypertension, BP check in 2 weeks/Nurse Visit.   This note has been created with Education officer, environmental. Any transcriptional errors are unintentional.    Doris Cheadle, MD

## 2014-12-25 NOTE — Progress Notes (Signed)
Patient here for follow up Patient states her for a check up Presents in office with elevated blood pressure

## 2015-01-02 ENCOUNTER — Ambulatory Visit: Payer: No Typology Code available for payment source | Attending: Internal Medicine

## 2015-01-05 ENCOUNTER — Other Ambulatory Visit: Payer: Self-pay | Admitting: Internal Medicine

## 2015-01-05 DIAGNOSIS — H409 Unspecified glaucoma: Secondary | ICD-10-CM

## 2015-01-05 MED ORDER — TRAVOPROST 0.004 % OP SOLN: 1.0000 [drp] | Freq: Every day | OPHTHALMIC | Status: DC

## 2015-02-07 ENCOUNTER — Ambulatory Visit: Payer: No Typology Code available for payment source | Admitting: Internal Medicine

## 2015-02-14 ENCOUNTER — Ambulatory Visit: Payer: No Typology Code available for payment source | Admitting: Internal Medicine

## 2015-03-21 ENCOUNTER — Ambulatory Visit: Payer: No Typology Code available for payment source | Attending: Internal Medicine

## 2015-04-05 ENCOUNTER — Telehealth: Payer: Self-pay | Admitting: Internal Medicine

## 2015-04-05 NOTE — Telephone Encounter (Signed)
Patient has called to ask PCP for a referral to [facility]; please f/u with patient about this request

## 2015-04-18 ENCOUNTER — Ambulatory Visit: Payer: No Typology Code available for payment source

## 2015-05-15 NOTE — Telephone Encounter (Signed)
Referral place 

## 2015-07-20 ENCOUNTER — Ambulatory Visit: Payer: No Typology Code available for payment source | Attending: Internal Medicine

## 2015-09-04 ENCOUNTER — Ambulatory Visit: Payer: No Typology Code available for payment source | Admitting: Internal Medicine

## 2015-09-17 ENCOUNTER — Ambulatory Visit: Payer: No Typology Code available for payment source | Admitting: Internal Medicine

## 2015-12-10 ENCOUNTER — Other Ambulatory Visit: Payer: Self-pay | Admitting: Internal Medicine

## 2015-12-10 DIAGNOSIS — H409 Unspecified glaucoma: Secondary | ICD-10-CM

## 2015-12-10 MED ORDER — TRAVOPROST 0.004 % OP SOLN: 1.0000 [drp] | Freq: Every day | OPHTHALMIC | Status: DC

## 2016-01-04 ENCOUNTER — Other Ambulatory Visit: Payer: Self-pay | Admitting: Internal Medicine

## 2016-01-04 ENCOUNTER — Ambulatory Visit: Payer: Self-pay | Admitting: Podiatry

## 2016-01-07 MED FILL — CARVEDILOL 6.25 MG TABLET: 6.25 | 30 days supply | Qty: 60 | Fill #0

## 2016-01-07 MED FILL — hydrALAZINE HCL 25 MG TABS: 25 | 30 days supply | Qty: 90 | Fill #0

## 2016-01-13 ENCOUNTER — Encounter (HOSPITAL_COMMUNITY): Payer: Self-pay | Admitting: Emergency Medicine

## 2016-01-13 ENCOUNTER — Emergency Department (INDEPENDENT_AMBULATORY_CARE_PROVIDER_SITE_OTHER)
Admission: EM | Admit: 2016-01-13 | Discharge: 2016-01-13 | Disposition: A | Discharge status: Home / Self Care | Payer: No Typology Code available for payment source | Source: Home / Self Care | Attending: Emergency Medicine | Admitting: Emergency Medicine

## 2016-01-13 DIAGNOSIS — M546 Pain in thoracic spine: Secondary | ICD-10-CM

## 2016-01-13 LAB — POCT URINALYSIS DIP (DEVICE)
Bilirubin Urine: NEGATIVE
Glucose, UA: 100 mg/dL — AB
HGB URINE DIPSTICK: NEGATIVE
Ketones, ur: NEGATIVE mg/dL
LEUKOCYTES UA: NEGATIVE
Nitrite: NEGATIVE
Protein, ur: NEGATIVE mg/dL
SPECIFIC GRAVITY, URINE: 1.02 (ref 1.005–1.030)
UROBILINOGEN UA: 0.2 mg/dL (ref 0.0–1.0)
pH: 6.5 (ref 5.0–8.0)

## 2016-01-13 NOTE — Discharge Instructions (Signed)
Back Pain, Adult Apply heat to the area pain of the right back. Perform stretches as demonstrated 2-3 times a day. He may take OTC medication to help relief pain such as Tylenol or ibuprofen or Aleve. For worsening, new symptoms or problems, shortness of breath, cough or fever or chest pain seek medical attention promptly. Back pain is very common in adults.The cause of back pain is rarely dangerous and the pain often gets better over time.The cause of your back pain may not be known. Some common causes of back pain include:  Strain of the muscles or ligaments supporting the spine.  Wear and tear (degeneration) of the spinal disks.  Arthritis.  Direct injury to the back. For many people, back pain may return. Since back pain is rarely dangerous, most people can learn to manage this condition on their own. HOME CARE INSTRUCTIONS Watch your back pain for any changes. The following actions may help to lessen any discomfort you are feeling:  Remain active. It is stressful on your back to sit or stand in one place for long periods of time. Do not sit, drive, or stand in one place for more than 30 minutes at a time. Take short walks on even surfaces as soon as you are able.Try to increase the length of time you walk each day.  Exercise regularly as directed by your health care provider. Exercise helps your back heal faster. It also helps avoid future injury by keeping your muscles strong and flexible.  Do not stay in bed.Resting more than 1-2 days can delay your recovery.  Pay attention to your body when you bend and lift. The most comfortable positions are those that put less stress on your recovering back. Always use proper lifting techniques, including:  Bending your knees.  Keeping the load close to your body.  Avoiding twisting.  Find a comfortable position to sleep. Use a firm mattress and lie on your side with your knees slightly bent. If you lie on your back, put a pillow under  your knees.  Avoid feeling anxious or stressed.Stress increases muscle tension and can worsen back pain.It is important to recognize when you are anxious or stressed and learn ways to manage it, such as with exercise.  Take medicines only as directed by your health care provider. Over-the-counter medicines to reduce pain and inflammation are often the most helpful.Your health care provider may prescribe muscle relaxant drugs.These medicines help dull your pain so you can more quickly return to your normal activities and healthy exercise.  Apply ice to the injured area:  Put ice in a plastic bag.  Place a towel between your skin and the bag.  Leave the ice on for 20 minutes, 2-3 times a day for the first 2-3 days. After that, ice and heat may be alternated to reduce pain and spasms.  Maintain a healthy weight. Excess weight puts extra stress on your back and makes it difficult to maintain good posture. SEEK MEDICAL CARE IF:  You have pain that is not relieved with rest or medicine.  You have increasing pain going down into the legs or buttocks.  You have pain that does not improve in one week.  You have night pain.  You lose weight.  You have a fever or chills. SEEK IMMEDIATE MEDICAL CARE IF:   You develop new bowel or bladder control problems.  You have unusual weakness or numbness in your arms or legs.  You develop nausea or vomiting.  You develop abdominal  pain.  You feel faint.   This information is not intended to replace advice given to you by your health care provider. Make sure you discuss any questions you have with your health care provider.   Document Released: 10/20/2005 Document Revised: 11/10/2014 Document Reviewed: 02/21/2014 Elsevier Interactive Patient Education [employer].

## 2016-01-13 NOTE — ED Provider Notes (Signed)
CSN: [id number]     Arrival date & time 01/13/16  1449 History   First MD Initiated Contact with Patient 01/13/16 1702     Chief Complaint  Patient presents with  . 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 During this time over the past week or so he has developed pain to the right parathoracic musculature. He states sometimes with movement particularly while driving this will elicit or exacerbate the pain. Denies any known trauma. No fall or other type injury. Denies urinary symptoms.  Patient is a 60 y.o. male presenting with back pain.  Back Pain Associated symptoms: no chest pain, no dysuria and no fever     Past Medical History  Diagnosis Date  . Pollen allergies    History reviewed. No pertinent past surgical history. No family history on file. Social History  Substance Use Topics  . Smoking status: Never Smoker   . Smokeless tobacco: None  . Alcohol Use: No    Review of Systems  Constitutional: Negative for fever, activity change and fatigue.  HENT: Negative.   Respiratory: Negative for choking, chest tightness, shortness of breath, wheezing and stridor.        Occasional cough. Patient attributes this to PND and allergies.  Cardiovascular: Negative for chest pain and leg swelling.  Gastrointestinal: Negative.   Genitourinary: Negative.  Negative for dysuria, urgency, frequency, hematuria, decreased urine volume, discharge, penile swelling, scrotal swelling, difficulty urinating and testicular pain.  Musculoskeletal: Positive for myalgias and back pain.  Skin: Negative.   Neurological: Negative.     Allergies  Review of patient's allergies indicates no known allergies.  Home Medications   Prior to Admission medications   Medication Sig Start Date End Date Taking? Authorizing Provider  albuterol  (PROVENTIL HFA;VENTOLIN HFA) 108 (90 BASE) MCG/ACT inhaler Inhale 1-2 puffs into the lungs every 6 (six) hours as needed for wheezing. 03/01/12 03/01/13  Reuben Likesavid C Keller, MD  carvedilol (COREG) 6.25 MG tablet Take 1 tablet (6.25 mg total) by mouth 2 (two) times daily with a meal. Must have office visit for refills 01/07/16   Quentin Angstlugbemiga E Jegede, MD  cetirizine (ZYRTEC) 10 MG tablet Take 10 mg by mouth daily.    Historical Provider, MD  Chlorpheniramine Maleate 12 MG CP24 Take 1 capsule (12 mg total) by mouth daily. 03/01/12   Reuben Likesavid C Keller, MD  fluticasone (FLONASE) 50 MCG/ACT nasal spray Place 2 sprays into the nose daily. 03/01/12 03/01/13  Reuben Likesavid C Keller, MD  hydrALAZINE (APRESOLINE) 25 MG tablet Take 1 tablet (25 mg total) by mouth 3 (three) times daily. Must have office visit for refills 01/07/16   Quentin Angstlugbemiga E Jegede, MD  travoprost, benzalkonium, (TRAVATAN) 0.004 % ophthalmic solution Place 1 drop into both eyes at bedtime. 12/10/15   Quentin Angstlugbemiga E Jegede, MD   Meds Ordered and Administered this Visit  Medications - No data to display  BP 158/84 mmHg  Pulse 89  Temp(Src) 97.4 F (36.3 C) (Oral)  SpO2 96% No data found.   Physical Exam  Constitutional: He is oriented to person, place, and time. He appears well-developed and well-nourished. No distress.  Eyes: EOM are normal.  Neck: Normal range of motion. Neck supple.  Cardiovascular: Normal rate.   Pulmonary/Chest: Effort normal. No respiratory distress. He has no wheezes. He has no rales. He exhibits no tenderness.  Musculoskeletal: He exhibits no edema.  Patient points  to the right para thoracic musculature as the source of pain. He also points to the left medial scapula as a source of pain typically with coughing. With deep palpation. With deep palpation to the area and which the patient points there is minor tenderness. The patient is able to flex and extend the spine without producing pain. Lateral flexion produces pain to the right  parathoracic area for which he complains of pain. No overlying skin changes. No swelling, no deformity. There is also minor tenderness with deep palpation to a small area just medial to the left scapula.  Neurological: He is alert and oriented to person, place, and time. He exhibits normal muscle tone.  Skin: Skin is warm and dry.  Psychiatric: He has a normal mood and affect.  Nursing note and vitals reviewed.   ED Course  Procedures (including critical care time)  Labs Review Labs Reviewed  POCT URINALYSIS DIP (DEVICE) - Abnormal; Notable for the following:    Glucose, UA 100 (*)    All other components within normal limits   Results for orders placed or performed during the hospital encounter of 01/13/16  POCT urinalysis dip (device)  Result Value Ref Range   Glucose, UA 100 (A) NEGATIVE mg/dL   Bilirubin Urine NEGATIVE NEGATIVE   Ketones, ur NEGATIVE NEGATIVE mg/dL   Specific Gravity, Urine 1.020 1.005 - 1.030   Hgb urine dipstick NEGATIVE NEGATIVE   pH 6.5 5.0 - 8.0   Protein, ur NEGATIVE NEGATIVE mg/dL   Urobilinogen, UA 0.2 0.0 - 1.0 mg/dL   Nitrite NEGATIVE NEGATIVE   Leukocytes, UA NEGATIVE NEGATIVE     Imaging Review No results found.   Visual Acuity Review  Right Eye Distance:   Left Eye Distance:   Bilateral Distance:    Right Eye Near:   Left Eye Near:    Bilateral Near:         MDM   1. Right-sided thoracic back pain    Apply heat to the area pain of the right back. Perform stretches as demonstrated 2-3 times a day. He may take OTC medication to help relief pain such as Tylenol or ibuprofen or Aleve. For worsening, new symptoms or problems, shortness of breath, cough or fever or chest pain seek medical attention promptly. Pt declined CXR    Hayden Rasmussen, NP 01/13/16 1719  Hayden Rasmussen, NP 01/13/16 1610

## 2016-01-13 NOTE — ED Notes (Signed)
C/o lower right side back pain onset x1 week... Reports he took OTC pain meds and pain subsided Pain flared up again yest... 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 Denies urinary sx, fevers, chills, n/v Pain is 0/10 at the moment.... Activity does not seem to increase pain Steady gait... A&O x4... No acuate distress.

## 2016-01-18 ENCOUNTER — Other Ambulatory Visit: Payer: Self-pay | Admitting: Internal Medicine

## 2016-01-25 ENCOUNTER — Ambulatory Visit: Payer: Self-pay | Attending: Internal Medicine

## 2016-01-25 ENCOUNTER — Other Ambulatory Visit: Payer: Self-pay | Admitting: Internal Medicine

## 2016-02-01 ENCOUNTER — Ambulatory Visit: Payer: Self-pay | Admitting: Podiatry

## 2016-02-04 ENCOUNTER — Telehealth: Payer: Self-pay

## 2016-02-04 ENCOUNTER — Telehealth: Payer: Self-pay | Admitting: Internal Medicine

## 2016-02-04 NOTE — Telephone Encounter (Signed)
Returned patient phone call Patient is requesting refills on his eye drops Informed patient that it was over a year ago that he was seen And it would have to be up to the new provider to reorder them

## 2016-02-04 NOTE — Telephone Encounter (Signed)
Patient came in requesting a medication refill for Travatan

## 2016-02-08 ENCOUNTER — Emergency Department (HOSPITAL_COMMUNITY)
Admission: EM | Admit: 2016-02-08 | Discharge: 2016-02-08 | Disposition: A | Discharge status: Home / Self Care | Payer: Self-pay | Attending: Emergency Medicine | Admitting: Emergency Medicine

## 2016-02-08 ENCOUNTER — Encounter (HOSPITAL_COMMUNITY): Payer: Self-pay | Admitting: *Deleted

## 2016-02-08 ENCOUNTER — Emergency Department (HOSPITAL_COMMUNITY): Payer: Self-pay

## 2016-02-08 DIAGNOSIS — J189 Pneumonia, unspecified organism: Secondary | ICD-10-CM

## 2016-02-08 DIAGNOSIS — R109 Unspecified abdominal pain: Secondary | ICD-10-CM

## 2016-02-08 DIAGNOSIS — I10 Essential (primary) hypertension: Secondary | ICD-10-CM | POA: Insufficient documentation

## 2016-02-08 DIAGNOSIS — J159 Unspecified bacterial pneumonia: Secondary | ICD-10-CM | POA: Insufficient documentation

## 2016-02-08 DIAGNOSIS — M546 Pain in thoracic spine: Secondary | ICD-10-CM | POA: Insufficient documentation

## 2016-02-08 DIAGNOSIS — Z79899 Other long term (current) drug therapy: Secondary | ICD-10-CM | POA: Insufficient documentation

## 2016-02-08 HISTORY — DX: Essential (primary) hypertension: I10

## 2016-02-08 LAB — CBC
HCT: 39.8 % (ref 39.0–52.0)
Hemoglobin: 13.6 g/dL (ref 13.0–17.0)
MCH: 30.1 pg (ref 26.0–34.0)
MCHC: 34.2 g/dL (ref 30.0–36.0)
MCV: 88.1 fL (ref 78.0–100.0)
PLATELETS: 200 10*3/uL (ref 150–400)
RBC: 4.52 MIL/uL (ref 4.22–5.81)
RDW: 12.5 % (ref 11.5–15.5)
WBC: 5.7 10*3/uL (ref 4.0–10.5)

## 2016-02-08 LAB — COMPREHENSIVE METABOLIC PANEL
ALBUMIN: 3.3 g/dL — AB (ref 3.5–5.0)
ALK PHOS: 78 U/L (ref 38–126)
ALT: 21 U/L (ref 17–63)
AST: 19 U/L (ref 15–41)
Anion gap: 9 (ref 5–15)
BUN: 16 mg/dL (ref 6–20)
CALCIUM: 8.6 mg/dL — AB (ref 8.9–10.3)
CO2: 26 mmol/L (ref 22–32)
CREATININE: 1.07 mg/dL (ref 0.61–1.24)
Chloride: 104 mmol/L (ref 101–111)
GFR calc Af Amer: >60 mL/min (ref >60)
GFR calc non Af Amer: >60 mL/min (ref >60)
GLUCOSE: 122 mg/dL — AB (ref 65–99)
Potassium: 3.8 mmol/L (ref 3.5–5.1)
SODIUM: 139 mmol/L (ref 135–145)
Total Bilirubin: 0.4 mg/dL (ref 0.3–1.2)
Total Protein: 6.8 g/dL (ref 6.5–8.1)

## 2016-02-08 LAB — URINALYSIS, ROUTINE W REFLEX MICROSCOPIC
BILIRUBIN URINE: NEGATIVE
GLUCOSE, UA: NEGATIVE mg/dL
HGB URINE DIPSTICK: NEGATIVE
Ketones, ur: NEGATIVE mg/dL
Leukocytes, UA: NEGATIVE
Nitrite: NEGATIVE
PROTEIN: NEGATIVE mg/dL
SPECIFIC GRAVITY, URINE: 1.021 (ref 1.005–1.030)
pH: 7 (ref 5.0–8.0)

## 2016-02-08 LAB — LIPASE, BLOOD: Lipase: 18 U/L (ref 11–51)

## 2016-02-08 MED ORDER — CIPROFLOXACIN HCL 500 MG PO TABS: 500.0000 mg | ORAL_TABLET | Freq: Two times a day (BID) | ORAL | Status: DC

## 2016-02-08 MED ORDER — LEVOFLOXACIN 500 MG PO TABS
500.0000 mg | ORAL_TABLET | Freq: Once | ORAL | Status: AC
  Administered 2016-02-08: 500 mg via ORAL
  Filled 2016-02-08: qty 1

## 2016-02-08 NOTE — ED Notes (Signed)
Dr.Pfeiffer made aware pt would like to see her.

## 2016-02-08 NOTE — ED Notes (Signed)
Pt back from ct scan.

## 2016-02-08 NOTE — ED Provider Notes (Signed)
CSN: [id number]     Arrival date & time 02/08/16  1706 History   First MD Initiated Contact with Patient 02/08/16 1920     Chief Complaint  Patient presents with  . Flank Pain     (Consider location/radiation/quality/duration/timing/severity/associated sxs/prior Treatment) HPI Patient reports he is having pain in his right flank area and lower thoracic back. He reports is sharp and fairly severe. It is made worse by cough, sneeze, laugh or certain movements. Patient denies he's had abdominal pain. No chest pain or shortness of breath.  Denies pain or difficulty urinating. He reports he had this pain for about a month ago and after a period of time it did improve. He has not had any injury that he knows of however he does report he had done some heavy lifting yesterday. 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 Past Medical History  Diagnosis Date  . Pollen allergies   . Hypertension    History reviewed. No pertinent past surgical history. No family history on file. Social History  Substance Use Topics  . Smoking status: Never Smoker   . Smokeless tobacco: None  . Alcohol Use: No    Review of Systems  10 Systems reviewed and are negative for acute change except as noted in the HPI.   Allergies  Review of patient's allergies indicates no known allergies.  Home Medications   Prior to Admission medications   Medication Sig Start Date End Date Taking? Authorizing Provider  travoprost, benzalkonium, (TRAVATAN) 0.004 % ophthalmic solution Place 1 drop into both eyes at bedtime. 12/10/15  Yes Quentin Angstlugbemiga E Jegede, MD  albuterol (PROVENTIL HFA;VENTOLIN HFA) 108 (90 BASE) MCG/ACT inhaler Inhale 1-2 puffs into the lungs every 6 (six) hours as needed for wheezing. 03/01/12 03/01/13  Reuben Likesavid C Keller, MD  carvedilol (COREG) 6.25 MG tablet Take 1 tablet (6.25 mg total) by mouth 2 (two) times daily with a meal. Must have office visit for refills 01/07/16   Quentin Angstlugbemiga E  Jegede, MD  cetirizine (ZYRTEC) 10 MG tablet Take 10 mg by mouth daily.    Historical Provider, MD  Chlorpheniramine Maleate 12 MG CP24 Take 1 capsule (12 mg total) by mouth daily. 03/01/12   Reuben Likesavid C Keller, MD  ciprofloxacin (CIPRO) 500 MG tablet Take 1 tablet (500 mg total) by mouth 2 (two) times daily. 02/08/16   Arby BarretteMarcy Kateena Degroote, MD  fluticasone (FLONASE) 50 MCG/ACT nasal spray Place 2 sprays into the nose daily. 03/01/12 03/01/13  Reuben Likesavid C Keller, MD  hydrALAZINE (APRESOLINE) 25 MG tablet Take 1 tablet (25 mg total) by mouth 3 (three) times daily. Must have office visit for refills 01/07/16   Quentin Angstlugbemiga E Jegede, MD   BP 159/99 mmHg  Pulse 68  Temp(Src) 98.7 F (37.1 C) (Oral)  Resp 20  Wt 174 lb 7 oz (79.124 kg)  SpO2 100% Physical Exam  Constitutional: He is oriented to person, place, and time. He appears well-developed and well-nourished.  HENT:  Head: Normocephalic and atraumatic.  Eyes: EOM are normal. Pupils are equal, round, and reactive to light.  Neck: Neck supple.  Cardiovascular: Normal rate, regular rhythm, normal heart sounds and intact distal pulses.   Pulmonary/Chest: Effort normal and breath sounds normal.  Abdominal: Soft. Bowel sounds are normal. He exhibits no distension. There is no tenderness.  Reproducible pain on the right flank. No rash or crepitus.  Musculoskeletal: Normal range of motion. He exhibits no edema or tenderness.  Neurological: He is  alert and oriented to person, place, and time. He has normal strength. No cranial nerve deficit. He exhibits normal muscle tone. Coordination normal. GCS eye subscore is 4. GCS verbal subscore is 5. GCS motor subscore is 6.  Skin: Skin is warm, dry and intact.  Psychiatric: He has a normal mood and affect.    ED Course  Procedures (including critical care time) Labs Review Labs Reviewed  COMPREHENSIVE METABOLIC PANEL - Abnormal; Notable for the following:    Glucose, Bld 122 (*)    Calcium 8.6 (*)    Albumin 3.3 (*)     All other components within normal limits  LIPASE, BLOOD  CBC  URINALYSIS, ROUTINE W REFLEX MICROSCOPIC (NOT AT [facility])    Imaging Review Ct Renal Stone Study  02/08/2016  CLINICAL DATA:  Right flank pain since yesterday. EXAM: CT ABDOMEN AND PELVIS WITHOUT CONTRAST TECHNIQUE: Multidetector CT imaging of the abdomen and pelvis was performed following the standard protocol without IV contrast. COMPARISON:  None. FINDINGS: Lung bases: Small right pleural effusion. There is dependent right lower lobe opacity. Although this could all be atelectasis, pneumonia is suspected. Heart normal in size. Liver, spleen, gallbladder, pancreas, adrenal glands:  Normal. Kidneys, ureters, bladder:  Normal. Lymph nodes:  No adenopathy. Ascites: None. Gastrointestinal: The mid portion of the appendix is dilated to 1 cm. There are no adjacent inflammatory changes, however. Stomach, colon and small bowel are unremarkable. Musculoskeletal: Minor endplate spurring noted along the visualized spine. Otherwise unremarkable. IMPRESSION: 1. Small right pleural effusion with associated dependent right lower lobe opacity. Pneumonia suspected. 2. Appendix is dilated in its midportion, but there is no associated inflammation. This may reflect a normal variant in this patient. If symptoms suggest appendicitis, however, short-term follow-up CT imaging in 12-24 hours to reassess the appendix would be recommended. 3. No other abnormalities. Electronically Signed   By: Amie Portland M.D.   On: 02/08/2016 23:00   I have personally reviewed and evaluated these images and lab results as part of my medical decision-making.   EKG Interpretation None      MDM   Final diagnoses:  Community acquired pneumonia  Flank pain   Patient presented with right flank pain. Initially this sounds suspicious for kidney stone. He had had an isolated episode of similar pain approximately a month ago. He subsequently developed pain again this week. CT  scan shows an infiltrate and small effusion at the base of the right lung. Lab tests are otherwise normal. Vital signs are otherwise normal. At this time, patient will be treated for community-acquired pneumonia and he is counseled on the necessity of follow-up for resolution of the finding. Patient reports he can only use his insurance card on Monday and will not be able to fill antibiotics over the weekend unless they are from the $4 list. Patient is given a dose of Levaquin in the emergency department and a prescription for ciprofloxacin. Been counseled that there is a risk of cancer or more serious cause for his lung finding and this is why it is important for him to follow-up and make sure that this has resolved. Patient is clinically well in appearance. He is alert and ambulatory without difficulty. Mental status is clear.    Arby Barrette, MD 02/08/16 2352

## 2016-02-08 NOTE — ED Notes (Signed)
The pt is c/o rt abd and flank pain since yesterday .  Worse with movement.  He denies urinary symptoms. He visibly jerks whenever he moves.  No known injury

## 2016-02-08 NOTE — Discharge Instructions (Signed)
Community-Acquired Pneumonia, Adult Your CT scan showed an area of pneumonia in the lung. It is very important that you have a recheck with your doctor to make sure that this goes away. Sometimes an abnormality in the long that looks like a pneumonia may be a cancer. This is why it is so important that it is rechecked. Pneumonia is an infection of the lungs. One type of pneumonia can happen while a person is in a hospital. A different type can happen when a person is not in a hospital (community-acquired pneumonia). It is easy for this kind to spread from person to person. It can spread to you if you breathe near an infected person who coughs or sneezes. Some symptoms include:  A dry cough.  A wet (productive) cough.  Fever.  Sweating.  Chest pain. HOME CARE  Take over-the-counter and prescription medicines only as told by your doctor.  Only take cough medicine if you are losing sleep.  If you were prescribed an antibiotic medicine, take it as told by your doctor. Do not stop taking the antibiotic even if you start to feel better.  Sleep with your head and neck raised (elevated). You can do this by putting a few pillows under your head, or you can sleep in a recliner.  Do not use tobacco products. These include cigarettes, chewing tobacco, and e-cigarettes. If you need help quitting, ask your doctor.  Drink enough water to keep your pee (urine) clear or pale yellow. A shot (vaccine) can help prevent pneumonia. Shots are often suggested for:  People older than 59 years of age.  People older than 59 years of age:  Who are having cancer treatment.  Who have long-term (chronic) lung disease.  Who have problems with their body's defense system (immune system). You may also prevent pneumonia if you take these actions:  Get the flu (influenza) shot every year.  Go to the dentist as often as told.  Wash your hands often. If soap and water are not available, use hand sanitizer. GET  HELP IF:  You have a fever.  You lose sleep because your cough medicine does not help. GET HELP RIGHT AWAY IF:  You are short of breath and it gets worse.  You have more chest pain.  Your sickness gets worse. This is very serious if:  You are an older adult.  Your body's defense system is weak.  You cough up blood.   This information is not intended to replace advice given to you by your health care provider. Make sure you discuss any questions you have with your health care provider.   Document Released: 04/07/2008 Document Revised: 07/11/2015 Document Reviewed: 02/14/2015 Elsevier Interactive Patient Education [employer]2016 Elsevier Inc.

## 2016-02-08 NOTE — ED Notes (Signed)
MD at bedside. 

## 2016-02-20 ENCOUNTER — Ambulatory Visit: Payer: Self-pay | Admitting: Family Medicine

## 2016-02-25 ENCOUNTER — Encounter: Payer: Self-pay | Admitting: Family Medicine

## 2016-03-04 ENCOUNTER — Encounter: Payer: Self-pay | Admitting: Family Medicine

## 2016-03-04 ENCOUNTER — Ambulatory Visit: Payer: Self-pay | Attending: Family Medicine | Admitting: Family Medicine

## 2016-03-04 VITALS — BP 148/84 | HR 69 | Temp 98.0°F | Resp 16 | Ht 66.0 in | Wt 178.0 lb

## 2016-03-04 DIAGNOSIS — B351 Tinea unguium: Secondary | ICD-10-CM | POA: Insufficient documentation

## 2016-03-04 DIAGNOSIS — K029 Dental caries, unspecified: Secondary | ICD-10-CM | POA: Insufficient documentation

## 2016-03-04 DIAGNOSIS — H401132 Primary open-angle glaucoma, bilateral, moderate stage: Secondary | ICD-10-CM

## 2016-03-04 DIAGNOSIS — Z79899 Other long term (current) drug therapy: Secondary | ICD-10-CM | POA: Insufficient documentation

## 2016-03-04 DIAGNOSIS — I10 Essential (primary) hypertension: Secondary | ICD-10-CM | POA: Insufficient documentation

## 2016-03-04 DIAGNOSIS — M545 Low back pain: Secondary | ICD-10-CM | POA: Insufficient documentation

## 2016-03-04 DIAGNOSIS — H401130 Primary open-angle glaucoma, bilateral, stage unspecified: Secondary | ICD-10-CM | POA: Insufficient documentation

## 2016-03-04 DIAGNOSIS — B353 Tinea pedis: Secondary | ICD-10-CM | POA: Insufficient documentation

## 2016-03-04 DIAGNOSIS — G8929 Other chronic pain: Secondary | ICD-10-CM | POA: Insufficient documentation

## 2016-03-04 DIAGNOSIS — B079 Viral wart, unspecified: Secondary | ICD-10-CM | POA: Insufficient documentation

## 2016-03-04 DIAGNOSIS — J189 Pneumonia, unspecified organism: Secondary | ICD-10-CM | POA: Insufficient documentation

## 2016-03-04 DIAGNOSIS — H409 Unspecified glaucoma: Secondary | ICD-10-CM | POA: Insufficient documentation

## 2016-03-04 HISTORY — DX: Tinea pedis: B35.3

## 2016-03-04 HISTORY — DX: Tinea unguium: B35.1

## 2016-03-04 MED ORDER — CARVEDILOL 6.25 MG PO TABS: 6.2500 mg | ORAL_TABLET | Freq: Two times a day (BID) | ORAL | Status: DC

## 2016-03-04 MED ORDER — HYDRALAZINE HCL 25 MG PO TABS: 25.0000 mg | ORAL_TABLET | Freq: Three times a day (TID) | ORAL | Status: DC

## 2016-03-04 MED ORDER — TRAVOPROST 0.004 % OP SOLN: 1.0000 [drp] | Freq: Every day | OPHTHALMIC | Status: DC

## 2016-03-04 MED ORDER — TERBINAFINE HCL 250 MG PO TABS: 250.0000 mg | ORAL_TABLET | Freq: Every day | ORAL | Status: DC

## 2016-03-04 MED ORDER — KETOCONAZOLE 2 % EX CREA: 1.0000 "application " | TOPICAL_CREAM | Freq: Every day | CUTANEOUS | Status: DC

## 2016-03-04 NOTE — Progress Notes (Signed)
Subjective:  Patient ID: Ronald LigasFaisal A Kingma, male    DOB: 10/27/1956  Age: 60 y.o. MRN: [id number]  CC: Hypertension and Hospitalization Follow-up   HPI Ronald LigasFaisal A Lizana presents for    1. ED f/u CAP: he has repeated cipro. No CP, SOB or cough. No fever or chills. Chronic non smoker.   2.glaucoma: last saw eye doctor 2 years ago. Request travatan refill. No eye pain or redness. No change in vision.  3. HTN: taking hydralazine BID instead of TID. No HA,CP, SOB or leg swelling.   4. Lumbar back pain: chronic. 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 No added lumbar support pain does not radiate.  5. Wart on finger: for > 10 years. S/p cryotherapy x one treatment. Warts is thick and tender at times.   6. Toenail fungus: no pain. Tried topical ointments and creams. Request treatment.   Social History  Substance Use Topics  . Smoking status: Never Smoker   . Smokeless tobacco: Not on file  . Alcohol Use: No    Outpatient Prescriptions Prior to Visit  Medication Sig Dispense Refill  . albuterol (PROVENTIL HFA;VENTOLIN HFA) 108 (90 BASE) MCG/ACT inhaler Inhale 1-2 puffs into the lungs every 6 (six) hours as needed for wheezing. 1 Inhaler 0  . carvedilol (COREG) 6.25 MG tablet Take 1 tablet (6.25 mg total) by mouth 2 (two) times daily with a meal. Must have office visit for refills 60 tablet 0  . cetirizine (ZYRTEC) 10 MG tablet Take 10 mg by mouth daily.    . Chlorpheniramine Maleate 12 MG CP24 Take 1 capsule (12 mg total) by mouth daily. 15 each 3  . ciprofloxacin (CIPRO) 500 MG tablet Take 1 tablet (500 mg total) by mouth 2 (two) times daily. 20 tablet 0  . fluticasone (FLONASE) 50 MCG/ACT nasal spray Place 2 sprays into the nose daily. 16 g 0  . hydrALAZINE (APRESOLINE) 25 MG tablet Take 1 tablet (25 mg total) by mouth 3 (three) times daily. Must have office visit for refills 90 tablet 0  . travoprost, benzalkonium, (TRAVATAN) 0.004 % ophthalmic solution Place 1 drop into both eyes at  bedtime. 45 mL 3   No facility-administered medications prior to visit.    ROS Review of Systems  Constitutional: Negative for fever, chills, fatigue and unexpected weight change.  Eyes: Negative for visual disturbance.  Respiratory: Negative for cough and shortness of breath.   Cardiovascular: Negative for chest pain, palpitations and leg swelling.  Gastrointestinal: Negative for nausea, vomiting, abdominal pain, diarrhea, constipation and blood in stool.  Endocrine: Negative for polydipsia, polyphagia and polyuria.  Musculoskeletal: Positive for back pain. Negative for myalgias, arthralgias, gait problem and neck pain.  Skin: Positive for rash.  Allergic/Immunologic: Negative for immunocompromised state.  Hematological: Negative for adenopathy. Does not bruise/bleed easily.  Psychiatric/Behavioral: Negative for suicidal ideas, sleep disturbance and dysphoric mood. The patient is not nervous/anxious.     Objective:  BP 148/84 mmHg  Pulse 69  Temp(Src) 98 F (36.7 C) (Oral)  Resp 16  Ht 5\' 6"  (1.676 m)  Wt 178 lb (80.74 kg)  BMI 28.74 kg/m2  SpO2 98%  BP/Weight 03/04/2016 02/08/2016 01/13/2016  Systolic BP 148 159 158  Diastolic BP 84 99 84  Wt. (Lbs) 178 174.44 -  BMI 28.74 28.17 -    Physical Exam  Constitutional: He appears well-developed and well-nourished. No distress.  HENT:  Head: Normocephalic and atraumatic.  Neck: Normal range of motion. Neck supple.  Cardiovascular: Normal rate, regular  rhythm, normal heart sounds and intact distal pulses.   Pulmonary/Chest: Effort normal and breath sounds normal.  Musculoskeletal: He exhibits no edema.       Hands: Neurological: He is alert.  Skin: Skin is warm and dry. No rash noted. No erythema.  Thickened and yellowed toenails Dry and scaly skin on plantar aspect of feet   Psychiatric: He has a normal mood and affect.   Treated finger wart with cryotherapy x 3 free thaw cycles   Assessment & Plan:   There are no  diagnoses linked to this encounter. Beckem was seen today for hypertension and hospitalization follow-up.  Diagnoses and all orders for this visit:  Essential hypertension -     carvedilol (COREG) 6.25 MG tablet; Take 1 tablet (6.25 mg total) by mouth 2 (two) times daily with a meal. -     hydrALAZINE (APRESOLINE) 25 MG tablet; Take 1 tablet (25 mg total) by mouth 3 (three) times daily.  Dental cavities -     Ambulatory referral to Dentistry  Onychomycosis of toenail -     terbinafine (LAMISIL) 250 MG tablet; Take 1 tablet (250 mg total) by mouth daily.  Tinea pedis of both feet -     ketoconazole (NIZORAL) 2 % cream; Apply 1 application topically daily.  Primary open angle glaucoma of both eyes, moderate stage -     Ambulatory referral to Ophthalmology -     travoprost, benzalkonium, (TRAVATAN) 0.004 % ophthalmic solution; Place 1 drop into both eyes at bedtime.  Viral wart on finger  CAP (community acquired pneumonia) -     DG Chest 2 View; Future  Other orders -     Discontinue: travoprost, benzalkonium, (TRAVATAN) 0.004 % ophthalmic solution; Place 1 drop into both eyes at bedtime.   Meds ordered this encounter  Medications  . carvedilol (COREG) 6.25 MG tablet    Sig: Take 1 tablet (6.25 mg total) by mouth 2 (two) times daily with a meal.    Dispense:  60 tablet    Refill:  5  . hydrALAZINE (APRESOLINE) 25 MG tablet    Sig: Take 1 tablet (25 mg total) by mouth 3 (three) times daily.    Dispense:  90 tablet    Refill:  5  . DISCONTD: travoprost, benzalkonium, (TRAVATAN) 0.004 % ophthalmic solution    Sig: Place 1 drop into both eyes at bedtime.    Dispense:  45 mL    Refill:  0  . terbinafine (LAMISIL) 250 MG tablet    Sig: Take 1 tablet (250 mg total) by mouth daily.    Dispense:  30 tablet    Refill:  2  . ketoconazole (NIZORAL) 2 % cream    Sig: Apply 1 application topically daily.    Dispense:  15 g    Refill:  0  . travoprost, benzalkonium, (TRAVATAN)  0.004 % ophthalmic solution    Sig: Place 1 drop into both eyes at bedtime.    Dispense:  45 mL    Refill:  2    Follow-up: No Follow-up on file.   Dessa Phi MD

## 2016-03-04 NOTE — Progress Notes (Signed)
F/U HTN, pneumonia  Complaining of fungal on feet Medicine refills  No pain today  No tobacco user  No suicidal thought in the past two weeks

## 2016-03-04 NOTE — Patient Instructions (Addendum)
Ronald Massey was seen today for hypertension and hospitalization follow-up.  Diagnoses and all orders for this visit:  Essential hypertension -     carvedilol (COREG) 6.25 MG tablet; Take 1 tablet (6.25 mg total) by mouth 2 (two) times daily with a meal. -     hydrALAZINE (APRESOLINE) 25 MG tablet; Take 1 tablet (25 mg total) by mouth 3 (three) times daily.  Dental cavities -     Ambulatory referral to Dentistry  Onychomycosis of toenail -     terbinafine (LAMISIL) 250 MG tablet; Take 1 tablet (250 mg total) by mouth daily.  Tinea pedis of both feet -     ketoconazole (NIZORAL) 2 % cream; Apply 1 application topically daily.  Primary open angle glaucoma of both eyes, moderate stage -     Ambulatory referral to Ophthalmology -     travoprost, benzalkonium, (TRAVATAN) 0.004 % ophthalmic solution; Place 1 drop into both eyes at bedtime.  Viral wart on finger  CAP (community acquired pneumonia) -     DG Chest 2 View; Future  Other orders -     Discontinue: travoprost, benzalkonium, (TRAVATAN) 0.004 % ophthalmic solution; Place 1 drop into both eyes at bedtime.   Repeat chest x-ray between 03/21/2016-04/04/2016 to check for complete resolution of pneumonia  F/u in 4 weeks for BP check and repeat cryotherapy for finger wart   Dr. Armen PickupFunches

## 2016-03-05 MED FILL — hydrALAZINE HCL 25 MG TABS: 25 | 30 days supply | Qty: 90 | Fill #0

## 2016-03-05 MED FILL — KETOCONAZOLE 2% CREAM: 2 | 7 days supply | Qty: 15 | Fill #0

## 2016-03-05 MED FILL — TERBINAFINE HCL 250 MG TAB: 250 | 30 days supply | Qty: 30 | Fill #0

## 2016-03-05 MED FILL — CARVEDILOL 6.25 MG TABLET: 6.25 | 30 days supply | Qty: 60 | Fill #0

## 2016-03-06 ENCOUNTER — Encounter: Payer: Self-pay | Admitting: Family Medicine

## 2016-03-06 DIAGNOSIS — J189 Pneumonia, unspecified organism: Secondary | ICD-10-CM | POA: Insufficient documentation

## 2016-03-06 NOTE — Assessment & Plan Note (Signed)
Uncontrolled Advised TID hydralazine as prescribed  Keep coreg dose the same

## 2016-03-06 NOTE — Assessment & Plan Note (Signed)
Suspect resolution F/u CXR ordered

## 2016-03-19 ENCOUNTER — Ambulatory Visit (HOSPITAL_COMMUNITY)
Admission: RE | Admit: 2016-03-19 | Discharge: 2016-03-19 | Disposition: A | Discharge status: Home / Self Care | Payer: Self-pay | Source: Ambulatory Visit | Attending: Family Medicine | Admitting: Family Medicine

## 2016-03-19 DIAGNOSIS — J189 Pneumonia, unspecified organism: Secondary | ICD-10-CM | POA: Insufficient documentation

## 2016-03-19 MED FILL — TRAVATAN Z 0.004% EYE DROP: 0.004 | 30 days supply | Qty: 5 | Fill #0

## 2016-03-24 ENCOUNTER — Telehealth: Payer: Self-pay | Admitting: *Deleted

## 2016-03-24 NOTE — Telephone Encounter (Signed)
-----   Message from Dessa PhiJosalyn Funches, MD sent at 03/23/2016  8:35 PM EDT ----- No pneumonia There are chronic changes that reflect some chronic bronchitic changes

## 2016-03-24 NOTE — Telephone Encounter (Signed)
Unable to contact pt Per male, wrong number  Communication letter send

## 2016-04-04 ENCOUNTER — Telehealth: Payer: Self-pay | Admitting: Family Medicine

## 2016-04-04 NOTE — Telephone Encounter (Signed)
Patient is requesting a dental referral and eye referral...  Patient is also following up with nurse....  Phone has been updated.

## 2016-04-07 NOTE — Telephone Encounter (Signed)
Both referrals were placed on 03/04/2016 Patient is uninsured and was contacted by the referral coordinator regarding opthalmology and will be contacted directly by [facility] per the referral notes  Patient advised to apply for the orange card and [place] discount if he has not already done so.

## 2016-05-09 ENCOUNTER — Encounter: Payer: Self-pay | Admitting: Family Medicine

## 2016-05-15 MED FILL — CARVEDILOL 6.25 MG TABLET: 6.25 | 30 days supply | Qty: 60 | Fill #1

## 2016-05-15 MED FILL — hydrALAZINE HCL 25 MG TABS: 25 | 30 days supply | Qty: 90 | Fill #1

## 2016-05-19 ENCOUNTER — Encounter: Payer: Self-pay | Admitting: Family Medicine

## 2016-05-20 ENCOUNTER — Telehealth: Payer: Self-pay | Admitting: Family Medicine

## 2016-05-20 MED FILL — TRAVATAN Z 0.004% EYE DROP: 0.004 | 30 days supply | Qty: 5 | Fill #1

## 2016-05-20 NOTE — Telephone Encounter (Signed)
Patient needs another letter explaining the information for [facility] for optometry Please follow up.

## 2016-05-21 NOTE — Telephone Encounter (Signed)
Noted  Mail a letter to patient with the information of [facility]

## 2016-05-26 ENCOUNTER — Ambulatory Visit: Payer: Self-pay | Attending: Family Medicine | Admitting: Family Medicine

## 2016-05-26 VITALS — BP 151/84 | HR 81 | Temp 97.9°F | Resp 17 | Ht 68.0 in | Wt 177.4 lb

## 2016-05-26 DIAGNOSIS — J301 Allergic rhinitis due to pollen: Secondary | ICD-10-CM | POA: Insufficient documentation

## 2016-05-26 DIAGNOSIS — Z114 Encounter for screening for human immunodeficiency virus [HIV]: Secondary | ICD-10-CM

## 2016-05-26 DIAGNOSIS — B079 Viral wart, unspecified: Secondary | ICD-10-CM

## 2016-05-26 DIAGNOSIS — B353 Tinea pedis: Secondary | ICD-10-CM

## 2016-05-26 DIAGNOSIS — H409 Unspecified glaucoma: Secondary | ICD-10-CM

## 2016-05-26 DIAGNOSIS — I10 Essential (primary) hypertension: Secondary | ICD-10-CM | POA: Insufficient documentation

## 2016-05-26 DIAGNOSIS — Z0000 Encounter for general adult medical examination without abnormal findings: Secondary | ICD-10-CM

## 2016-05-26 DIAGNOSIS — M7989 Other specified soft tissue disorders: Secondary | ICD-10-CM

## 2016-05-26 DIAGNOSIS — Z79899 Other long term (current) drug therapy: Secondary | ICD-10-CM | POA: Insufficient documentation

## 2016-05-26 DIAGNOSIS — E781 Pure hyperglyceridemia: Secondary | ICD-10-CM | POA: Insufficient documentation

## 2016-05-26 DIAGNOSIS — Z1159 Encounter for screening for other viral diseases: Secondary | ICD-10-CM

## 2016-05-26 DIAGNOSIS — B351 Tinea unguium: Secondary | ICD-10-CM

## 2016-05-26 LAB — HEMOCCULT GUIAC POC 1CARD (OFFICE): Fecal Occult Blood, POC: NEGATIVE

## 2016-05-26 MED ORDER — TERBINAFINE HCL 250 MG PO TABS: 250.0000 mg | ORAL_TABLET | Freq: Every day | ORAL | 1 refills | Status: DC

## 2016-05-26 MED FILL — TERBINAFINE HCL 250 MG TAB: 250 | 30 days supply | Qty: 30 | Fill #0

## 2016-05-26 NOTE — Progress Notes (Signed)
SUBJECTIVE:  Ronald Massey is a 60 y.o. male presenting for his annual checkup.   He is taking hydralazine BID only He request referral to eye doctor and dentist He request excision of wart from finger   Social History  Substance Use Topics  . Smoking status: Never Smoker  . Smokeless tobacco: Not on file  . Alcohol use No   Past Medical History:  Diagnosis Date  . Hypertension   . Pollen allergies    Current Outpatient Prescriptions  Medication Sig Dispense Refill  . carvedilol (COREG) 6.25 MG tablet Take 1 tablet (6.25 mg total) by mouth 2 (two) times daily with a meal. 60 tablet 5  . hydrALAZINE (APRESOLINE) 25 MG tablet Take 1 tablet (25 mg total) by mouth 3 (three) times daily. 90 tablet 5  . ketoconazole (NIZORAL) 2 % cream Apply 1 application topically daily. 15 g 0  . travoprost, benzalkonium, (TRAVATAN) 0.004 % ophthalmic solution Place 1 drop into both eyes at bedtime. 45 mL 2  . albuterol (PROVENTIL HFA;VENTOLIN HFA) 108 (90 BASE) MCG/ACT inhaler Inhale 1-2 puffs into the lungs every 6 (six) hours as needed for wheezing. 1 Inhaler 0  . cetirizine (ZYRTEC) 10 MG tablet Take 10 mg by mouth daily.    . fluticasone (FLONASE) 50 MCG/ACT nasal spray Place 2 sprays into the nose daily. 16 g 0  . terbinafine (LAMISIL) 250 MG tablet Take 1 tablet (250 mg total) by mouth daily. (Patient not taking: Reported on 05/26/2016) 30 tablet 2   No current facility-administered medications for this visit.    Allergies: Review of patient's allergies indicates no known allergies.   ROS:  Feeling well. No dyspnea or chest pain on exertion. Slight swelling in L leg comes and goes. No abdominal pain, change in bowel habits, black or bloody stools. No urinary tract or prostatic symptoms. No neurological complaints.  OBJECTIVE:  The patient appears well, alert, oriented x 3, in no distress.  BP (!) 151/84 (BP Location: Right Arm, Cuff Size: Large)   Pulse 81   Temp 97.9 F (36.6 C) (Oral)    Resp 17   Ht 5\' 8"  (1.727 m)   Wt 177 lb 6.4 oz (80.5 kg)   SpO2 97%   BMI 26.97 kg/m  ENT normal.  Neck supple. No adenopathy or thyromegaly. PERLA. Lungs are clear, good air entry, no wheezes, rhonchi or rales. S1 and S2 normal, no murmurs, regular rate and rhythm. Abdomen is soft without tenderness, guarding, mass or organomegaly. GU exam: guaiac negative brown stool.  Extremities show no edema, normal peripheral pulses. Neurological is normal without focal findings. Brown toenails   ASSESSMENT:  healthy adult male  PLAN:  Non-fasting lipids normal except for elevated Triglycerides most likely due to non fasting state  Recommend screening A1c at next OV, decrease intake of sugars and increase exercise Also recommend statin, lipitor 20 mg daily to reduce risk of heart disease and stroke since patient has high blood pressure   F.u in 3 months for HTN

## 2016-05-26 NOTE — Patient Instructions (Addendum)
Terique was seen today for annual exam.  Diagnoses and all orders for this visit:  Essential hypertension -     COMPLETE METABOLIC PANEL WITH GFR -     Lipid Panel  Glaucoma -     Ambulatory referral to Ophthalmology  Healthcare maintenance -     Ambulatory referral to Gastroenterology -     Ambulatory referral to Dentistry  Tinea pedis of both feet  Leg swelling -     CBC  Viral wart on finger -     Ambulatory referral to Dermatology  Onychomycosis of toenail -     terbinafine (LAMISIL) 250 MG tablet; Take 1 tablet (250 mg total) by mouth daily.    F/u in 2 months for HTN  Dr. Armen Pickup

## 2016-05-26 NOTE — Progress Notes (Signed)
PATIENT IS HAVING SWELLING IN HIS LOWER LEGS

## 2016-05-27 ENCOUNTER — Encounter: Payer: Self-pay | Admitting: Family Medicine

## 2016-05-27 ENCOUNTER — Telehealth: Payer: Self-pay

## 2016-05-27 LAB — CBC
HEMATOCRIT: 41.3 % (ref 38.5–50.0)
HEMOGLOBIN: 14.1 g/dL (ref 13.2–17.1)
MCH: 30.5 pg (ref 27.0–33.0)
MCHC: 34.1 g/dL (ref 32.0–36.0)
MCV: 89.4 fL (ref 80.0–100.0)
MPV: 9.8 fL (ref 7.5–12.5)
Platelets: 187 10*3/uL (ref 140–400)
RBC: 4.62 MIL/uL (ref 4.20–5.80)
RDW: 14.7 % (ref 11.0–15.0)
WBC: 4.2 10*3/uL (ref 3.8–10.8)

## 2016-05-27 LAB — COMPLETE METABOLIC PANEL WITH GFR
ALT: 17 U/L (ref 9–46)
AST: 15 U/L (ref 10–35)
Albumin: 3.6 g/dL (ref 3.6–5.1)
Alkaline Phosphatase: 75 U/L (ref 40–115)
BUN: 16 mg/dL (ref 7–25)
CHLORIDE: 107 mmol/L (ref 98–110)
CO2: 26 mmol/L (ref 20–31)
Calcium: 8.5 mg/dL — ABNORMAL LOW (ref 8.6–10.3)
Creat: 1 mg/dL (ref 0.70–1.33)
GFR, Est African American: >89 mL/min (ref >=60)
GFR, Est Non African American: 82 mL/min (ref >=60)
GLUCOSE: 160 mg/dL — AB (ref 65–99)
POTASSIUM: 4.3 mmol/L (ref 3.5–5.3)
SODIUM: 139 mmol/L (ref 135–146)
Total Bilirubin: 0.3 mg/dL (ref 0.2–1.2)
Total Protein: 6.6 g/dL (ref 6.1–8.1)

## 2016-05-27 LAB — LIPID PANEL
Cholesterol: 174 mg/dL (ref 125–200)
HDL: 47 mg/dL (ref >=40)
LDL CALC: 93 mg/dL (ref <130)
TRIGLYCERIDES: 171 mg/dL — AB (ref <150)
Total CHOL/HDL Ratio: 3.7 Ratio (ref <=5.0)
VLDL: 34 mg/dL — AB (ref <30)

## 2016-05-27 LAB — HIV ANTIBODY (ROUTINE TESTING W REFLEX): HIV 1&2 Ab, 4th Generation: NONREACTIVE

## 2016-05-27 LAB — HEPATITIS C ANTIBODY: HCV Ab: NEGATIVE

## 2016-05-27 MED ORDER — ATORVASTATIN CALCIUM 20 MG PO TABS: 20.0000 mg | ORAL_TABLET | Freq: Every day | ORAL | 3 refills | Status: DC

## 2016-05-27 NOTE — Assessment & Plan Note (Signed)
BP elevated Advised hydralazine TID as prescribed  Non-fasting lipids normal except for elevated Triglycerides most likely due to non fasting state  Recommend screening A1c at next OV, decrease intake of sugars and increase exercise Also recommend statin, lipitor 20 mg daily to reduce risk of heart disease and stroke since patient has high blood pressure

## 2016-05-27 NOTE — Telephone Encounter (Signed)
Pt called back states call got disconnected, please f/up

## 2016-05-27 NOTE — Telephone Encounter (Signed)
Patient was called on 05/27/2016 @ 2:33 results were given to the patient. The phone disconnected at the end of the conversation.

## 2016-06-26 MED FILL — TRAVATAN Z 0.004% EYE DROP: 0.004 | 30 days supply | Qty: 5 | Fill #2

## 2016-06-26 MED FILL — TERBINAFINE HCL 250 MG TAB: 250 | 30 days supply | Qty: 30 | Fill #1

## 2016-06-26 MED FILL — CARVEDILOL 6.25 MG TABLET: 6.25 | 30 days supply | Qty: 60 | Fill #2

## 2016-06-26 MED FILL — hydrALAZINE HCL 25 MG TABS: 25 | 30 days supply | Qty: 90 | Fill #2

## 2016-08-06 MED FILL — hydrALAZINE HCL 25 MG TABS: 25 | 30 days supply | Qty: 90 | Fill #3

## 2016-08-06 MED FILL — TRAVATAN Z 0.004% EYE DROP: 0.004 | 30 days supply | Qty: 5 | Fill #3

## 2016-08-06 MED FILL — CARVEDILOL 6.25 MG TABLET: 6.25 | 30 days supply | Qty: 60 | Fill #3

## 2016-08-28 MED FILL — hydrALAZINE HCL 25 MG TABS: 25 | 30 days supply | Qty: 90 | Fill #4

## 2016-08-28 MED FILL — TRAVATAN Z 0.004% EYE DROP: 0.004 | 30 days supply | Qty: 5 | Fill #4

## 2016-08-28 MED FILL — CARVEDILOL 6.25 MG TABLET: 6.25 | 30 days supply | Qty: 60 | Fill #4

## 2016-09-20 ENCOUNTER — Encounter (HOSPITAL_COMMUNITY): Payer: Self-pay | Admitting: Emergency Medicine

## 2016-09-20 ENCOUNTER — Ambulatory Visit (HOSPITAL_COMMUNITY)
Admission: EM | Admit: 2016-09-20 | Discharge: 2016-09-20 | Disposition: A | Discharge status: Home / Self Care | Payer: Self-pay | Attending: Family Medicine | Admitting: Family Medicine

## 2016-09-20 DIAGNOSIS — J069 Acute upper respiratory infection, unspecified: Secondary | ICD-10-CM

## 2016-09-20 MED ORDER — IPRATROPIUM-ALBUTEROL 0.5-2.5 (3) MG/3ML IN SOLN
RESPIRATORY_TRACT | Status: AC
  Filled 2016-09-20: qty 3

## 2016-09-20 MED ORDER — ALBUTEROL SULFATE (2.5 MG/3ML) 0.083% IN NEBU
INHALATION_SOLUTION | RESPIRATORY_TRACT | Status: AC
  Filled 2016-09-20: qty 3

## 2016-09-20 MED ORDER — SODIUM CHLORIDE 0.9 % IN NEBU
INHALATION_SOLUTION | RESPIRATORY_TRACT | Status: AC
  Filled 2016-09-20: qty 3

## 2016-09-20 MED ORDER — ALBUTEROL SULFATE (2.5 MG/3ML) 0.083% IN NEBU
2.5000 mg | INHALATION_SOLUTION | Freq: Once | RESPIRATORY_TRACT | Status: AC
  Administered 2016-09-20: 2.5 mg via RESPIRATORY_TRACT

## 2016-09-20 MED ORDER — HYDROCOD POLST-CPM POLST ER 10-8 MG/5ML PO SUER: 5.0000 mL | Freq: Every evening | ORAL | 0 refills | Status: DC | PRN

## 2016-09-20 NOTE — ED Notes (Signed)
Patient was reluctant at discharge, demanding "something" to help him.  Unable to get medicines until Monday at usual pharmacy-can only pay the copay that specific pharmacy charges.  .  Looked on good rx, no comparable prices for medication.  Explained there is nothing given here to last him until Monday.  Notified dr Lum Babeeniola.  Ordered breathing treatment.

## 2016-09-20 NOTE — ED Triage Notes (Signed)
C/o cold sx onset x1 week associated w/dry cough, runny nose, fever  Taking OTC cough syrup w/no relief.   A&O x4... NAD

## 2016-09-20 NOTE — Discharge Instructions (Signed)
It was nice seeing you today. You likely have viral illness as the cause of your cough. Please use cough medicine as prescribed. Unfortunately we don't give cough medicine here. F/U as needed.

## 2016-09-20 NOTE — ED Provider Notes (Addendum)
[facility]    CSN: [id number] Arrival date & time: 09/20/16  1705     History   Chief Complaint Chief Complaint  Patient presents with  . URI    HPI Ronald Massey is a 60 y.o. male.   The history is provided by the patient. No language interpreter was used.  URI  Presenting symptoms: cough and fever   Presenting symptoms comment:  He did not measure his temp at home Severity:  Moderate Onset quality:  Gradual Duration:  1 week Timing:  Constant Progression:  Worsening (unable to sleep at night) Chronicity:  New Relieved by:  Nothing Worsened by:  Nothing Ineffective treatments:  None tried (He has not tried any medication at home) Associated symptoms: headaches and wheezing   Associated symptoms: no myalgias   Risk factors: sick contacts   Risk factors comment:  Everyone at home is sick.   Past Medical History:  Diagnosis Date  . Hypertension   . Pollen allergies     Patient Active Problem List   Diagnosis Date Noted  . Onychomycosis of toenail 03/04/2016  . Tinea pedis of both feet 03/04/2016  . Viral wart on finger 03/04/2016  . Dental cavities 12/25/2014  . HTN (hypertension) 09/16/2013  . Primary open angle glaucoma 03/08/2010    History reviewed. No pertinent surgical history.     Home Medications    Prior to Admission medications   Medication Sig Start Date End Date Taking? Authorizing Provider  atorvastatin (LIPITOR) 20 MG tablet Take 1 tablet (20 mg total) by mouth daily. 05/27/16  Yes Josalyn Funches, MD  carvedilol (COREG) 6.25 MG tablet Take 1 tablet (6.25 mg total) by mouth 2 (two) times daily with a meal. 03/04/16  Yes Josalyn Funches, MD  hydrALAZINE (APRESOLINE) 25 MG tablet Take 1 tablet (25 mg total) by mouth 3 (three) times daily. 03/04/16  Yes Josalyn Funches, MD  travoprost, benzalkonium, (TRAVATAN) 0.004 % ophthalmic solution Place 1 drop into both eyes at bedtime. 03/04/16  Yes Josalyn Funches, MD  albuterol (PROVENTIL  HFA;VENTOLIN HFA) 108 (90 BASE) MCG/ACT inhaler Inhale 1-2 puffs into the lungs every 6 (six) hours as needed for wheezing. 03/01/12 03/01/13  Reuben Likesavid C Keller, MD  cetirizine (ZYRTEC) 10 MG tablet Take 10 mg by mouth daily.    Historical Provider, MD  fluticasone (FLONASE) 50 MCG/ACT nasal spray Place 2 sprays into the nose daily. 03/01/12 03/01/13  Reuben Likesavid C Keller, MD  ketoconazole (NIZORAL) 2 % cream Apply 1 application topically daily. 03/04/16   Josalyn Funches, MD  terbinafine (LAMISIL) 250 MG tablet Take 1 tablet (250 mg total) by mouth daily. 05/26/16   Dessa PhiJosalyn Funches, MD    Family History History reviewed. No pertinent family history.  Social History Social History  Substance Use Topics  . Smoking status: Never Smoker  . Smokeless tobacco: Never Used  . Alcohol use No     Allergies   Patient has no known allergies.   Review of Systems Review of Systems  Constitutional: Positive for fever.  Respiratory: Positive for cough and wheezing.   Musculoskeletal: Negative for myalgias.  Neurological: Positive for headaches.     Physical Exam Triage Vital Signs ED Triage Vitals  Enc Vitals Group     BP 09/20/16 1758 157/77     Pulse Rate 09/20/16 1758 91     Resp 09/20/16 1758 16     Temp 09/20/16 1758 98.9 F (37.2 C)     Temp Source 09/20/16 1758 Oral  SpO2 09/20/16 1758 99 %     Weight --      Height --      Head Circumference --      Peak Flow --      Pain Score 09/20/16 1802 10     Pain Loc --      Pain Edu? --      Excl. in GC? --    No data found.   Updated Vital Signs BP 157/77 (BP Location: Left Arm)   Pulse 91   Temp 98.9 F (37.2 C) (Oral)   Resp 16   SpO2 99%   Visual Acuity Right Eye Distance:   Left Eye Distance:   Bilateral Distance:    Right Eye Near:   Left Eye Near:    Bilateral Near:     Physical Exam  Constitutional: He appears well-developed and well-nourished. No distress.  HENT:  Head: Normocephalic.  Right Ear: Tympanic  membrane normal.  Left Ear: Tympanic membrane normal.  Mouth/Throat: Uvula is midline, oropharynx is clear and moist and mucous membranes are normal.  Cardiovascular: Normal rate, regular rhythm and normal heart sounds.   No murmur heard. Pulmonary/Chest: Effort normal and breath sounds normal. No respiratory distress. He has no wheezes. He has no rales.  Nursing note and vitals reviewed.    UC Treatments / Results  Labs (all labs ordered are listed, but only abnormal results are displayed) Labs Reviewed - No data to display  EKG  EKG Interpretation None       Radiology No results found.  Procedures Procedures (including critical care time)  Medications Ordered in UC Medications - No data to display   Initial Impression / Assessment and Plan / UC Course  I have reviewed the triage vital signs and the nursing notes.  Pertinent labs & imaging results that were available during my care of the patient were reviewed by me and considered in my medical decision making (see chart for details).  Clinical Course as of Sep 20 1926  Sat Sep 20, 2016  1906 Likely viral URI Pulm exam completely normal. Tussionex prescribed prn cough. Advised not to use when driving or operating machinery. He verbalized understanding. He asked for cough medicine here which we apparently don't given. Patient informed. F/U as needed.  NB: He insisted on treatment. Albuterol treatment given.  [KE]    Clinical Course User Index [KE] Doreene ElandKehinde T Thaer Miyoshi, MD      Final Clinical Impressions(s) / UC Diagnoses   Final diagnoses:  None   Upper respiratory tract infection, unspecified type   New Prescriptions New Prescriptions   No medications on file     Doreene ElandKehinde T Sumi Lye, MD 09/20/16 1908    Doreene ElandKehinde T Tayjon Halladay, MD 09/20/16 [phone]

## 2016-10-23 MED FILL — hydrALAZINE HCL 25 MG TABS: 25 | 30 days supply | Qty: 90 | Fill #5

## 2016-10-23 MED FILL — CARVEDILOL 6.25 MG TABLET: 6.25 | 30 days supply | Qty: 60 | Fill #5

## 2016-10-23 MED FILL — TRAVATAN Z 0.004% EYE DROP: 0.004 | 30 days supply | Qty: 5 | Fill #5

## 2016-11-17 ENCOUNTER — Other Ambulatory Visit: Payer: Self-pay | Admitting: Family Medicine

## 2016-11-17 DIAGNOSIS — I10 Essential (primary) hypertension: Secondary | ICD-10-CM

## 2016-11-17 MED FILL — TRAVATAN Z 0.004% EYE DROP: 0.004 | 30 days supply | Qty: 5 | Fill #6

## 2016-11-17 MED FILL — CARVEDILOL 6.25 MG TABLET: 6.25 | 30 days supply | Qty: 60 | Fill #0

## 2016-11-17 MED FILL — hydrALAZINE HCL 25 MG TABS: 25 | 30 days supply | Qty: 90 | Fill #0

## 2016-12-31 ENCOUNTER — Other Ambulatory Visit: Payer: Self-pay | Admitting: Family Medicine

## 2016-12-31 DIAGNOSIS — I10 Essential (primary) hypertension: Secondary | ICD-10-CM

## 2017-01-01 MED FILL — CARVEDILOL 6.25 MG TABLET: 6.25 | 30 days supply | Qty: 60 | Fill #0

## 2017-02-25 ENCOUNTER — Other Ambulatory Visit: Payer: Self-pay | Admitting: Family Medicine

## 2017-02-25 DIAGNOSIS — I10 Essential (primary) hypertension: Secondary | ICD-10-CM

## 2017-02-25 MED FILL — $Travatan Z 0.004% Eye Drop: 0.004 | 30 days supply | Qty: 5 | Fill #7

## 2017-03-04 ENCOUNTER — Ambulatory Visit: Payer: Self-pay | Attending: Internal Medicine

## 2017-03-06 MED FILL — ?CARVEDILOL 6.25 MG TABLET: 6.25 | 30 days supply | Qty: 60 | Fill #0

## 2017-03-06 MED FILL — hydrALAZINE HCL 25 MG TABS: 25 | 30 days supply | Qty: 90 | Fill #0

## 2017-03-24 ENCOUNTER — Ambulatory Visit: Payer: Self-pay | Attending: Internal Medicine

## 2017-04-13 ENCOUNTER — Other Ambulatory Visit: Payer: Self-pay | Admitting: Family Medicine

## 2017-04-13 DIAGNOSIS — H401132 Primary open-angle glaucoma, bilateral, moderate stage: Secondary | ICD-10-CM

## 2017-04-14 ENCOUNTER — Ambulatory Visit: Payer: Self-pay | Admitting: Family Medicine

## 2017-04-14 ENCOUNTER — Ambulatory Visit: Payer: Self-pay | Attending: Family Medicine | Admitting: Family Medicine

## 2017-04-14 ENCOUNTER — Encounter: Payer: Self-pay | Admitting: Family Medicine

## 2017-04-14 VITALS — BP 157/90 | HR 70 | Temp 97.8°F | Wt 171.0 lb

## 2017-04-14 DIAGNOSIS — E1169 Type 2 diabetes mellitus with other specified complication: Secondary | ICD-10-CM | POA: Insufficient documentation

## 2017-04-14 DIAGNOSIS — Z23 Encounter for immunization: Secondary | ICD-10-CM

## 2017-04-14 DIAGNOSIS — E7800 Pure hypercholesterolemia, unspecified: Secondary | ICD-10-CM

## 2017-04-14 DIAGNOSIS — I10 Essential (primary) hypertension: Secondary | ICD-10-CM | POA: Insufficient documentation

## 2017-04-14 DIAGNOSIS — Z79899 Other long term (current) drug therapy: Secondary | ICD-10-CM | POA: Insufficient documentation

## 2017-04-14 DIAGNOSIS — R7301 Impaired fasting glucose: Secondary | ICD-10-CM

## 2017-04-14 DIAGNOSIS — E785 Hyperlipidemia, unspecified: Secondary | ICD-10-CM

## 2017-04-14 DIAGNOSIS — R3911 Hesitancy of micturition: Secondary | ICD-10-CM | POA: Insufficient documentation

## 2017-04-14 LAB — POCT GLYCOSYLATED HEMOGLOBIN (HGB A1C): Hemoglobin A1C: 6.6

## 2017-04-14 LAB — HEMOCCULT GUIAC POC 1CARD (OFFICE): FECAL OCCULT BLD: NEGATIVE

## 2017-04-14 MED ORDER — CARVEDILOL 6.25 MG PO TABS: ORAL_TABLET | ORAL | 2 refills | Status: DC

## 2017-04-14 MED ORDER — HYDRALAZINE HCL 25 MG PO TABS: 25.0000 mg | ORAL_TABLET | Freq: Two times a day (BID) | ORAL | 2 refills | Status: DC

## 2017-04-14 MED ORDER — CHLORTHALIDONE 25 MG PO TABS: 25.0000 mg | ORAL_TABLET | Freq: Every day | ORAL | 2 refills | Status: DC

## 2017-04-14 MED FILL — CARVEDILOL 6.25 MG TABLET: 6.25 | 30 days supply | Qty: 60 | Fill #0

## 2017-04-14 MED FILL — hydrALAZINE HCL 25 MG TABS: 25 | 30 days supply | Qty: 60 | Fill #0

## 2017-04-14 MED FILL — ?CHLORTHALIDONE 25 MG TABLE: 25 | 30 days supply | Qty: 30 | Fill #0

## 2017-04-14 NOTE — Progress Notes (Signed)
Subjective:  Patient ID: Ronald Massey, male    DOB: [birth year]  Age: 61 y.o. MRN: [id number]  CC: Hypertension   HPI KEON PENDER has HTN, presents for   1. CHRONIC HYPERTENSION  Disease Monitoring  Blood pressure range: not checking   Chest pain: no   Dyspnea: no   Claudication: no   Medication compliance: yes, but taking only once daily due to Ramadan. Also only takes hydralazine BID.   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 Urinary hesitancy: for the past two days. Painless. No blood. Frequency at night since Ramadan, 3-4 time per night. He drinks a lot of water in the evening.   Social History  Substance Use Topics  . Smoking status: Never Smoker  . Smokeless tobacco: Never Used  . Alcohol use No    Outpatient Medications Prior to Visit  Medication Sig Dispense Refill  . atorvastatin (LIPITOR) 20 MG tablet Take 1 tablet (20 mg total) by mouth daily. 90 tablet 3  . carvedilol (COREG) 6.25 MG tablet TAKE 1 TABLET BY MOUTH 2 TIMES DAILY WITH A MEAL. 60 tablet 0  . travoprost, benzalkonium, (TRAVATAN) 0.004 % ophthalmic solution Place 1 drop into both eyes at bedtime. 45 mL 2  . albuterol (PROVENTIL HFA;VENTOLIN HFA) 108 (90 BASE) MCG/ACT inhaler Inhale 1-2 puffs into the lungs every 6 (six) hours as needed for wheezing. 1 Inhaler 0  . cetirizine (ZYRTEC) 10 MG tablet Take 10 mg by mouth daily.    . chlorpheniramine-HYDROcodone (TUSSIONEX PENNKINETIC ER) 10-8 MG/5ML SUER Take 5 mLs by mouth at bedtime as needed for cough. Do not use when driving. (Patient not taking: Reported on 04/14/2017) 115 mL 0  . fluticasone (FLONASE) 50 MCG/ACT nasal spray Place 2 sprays into the nose daily. 16 g 0  . hydrALAZINE (APRESOLINE) 25 MG tablet TAKE 1 TABLET BY MOUTH 3 TIMES DAILY. (Patient not taking: Reported on  04/14/2017) 90 tablet 0  . ketoconazole (NIZORAL) 2 % cream Apply 1 application topically daily. (Patient not taking: Reported on 04/14/2017) 15 g 0  . terbinafine (LAMISIL) 250 MG tablet Take 1 tablet (250 mg total) by mouth daily. (Patient not taking: Reported on 04/14/2017) 30 tablet 1   No facility-administered medications prior to visit.     ROS Review of Systems  Constitutional: Negative for chills, fatigue, fever and unexpected weight change.  Eyes: Negative for visual disturbance.  Respiratory: Negative for cough and shortness of breath.   Cardiovascular: Negative for chest pain, palpitations and leg swelling.  Gastrointestinal: Negative for abdominal pain, blood in stool, constipation, diarrhea, nausea and vomiting.  Endocrine: Negative for polydipsia, polyphagia and polyuria.  Genitourinary: Positive for frequency.  Musculoskeletal: Negative for arthralgias, back pain, gait problem, myalgias and neck pain.  Skin: Negative for rash.  Allergic/Immunologic: Negative for immunocompromised state.  Hematological: Negative for adenopathy. Does not bruise/bleed easily.  Psychiatric/Behavioral: Negative for dysphoric mood, sleep disturbance and suicidal ideas. The patient is not nervous/anxious.     Objective:  BP (!) 157/90   Pulse 70   Temp 97.8 F (36.6 C) (Oral)   Wt 171 lb (77.6 kg)   SpO2 97%   BMI 26.00 kg/m   BP/Weight 04/14/2017 09/20/2016 02/12/8785  Systolic BP 767 209 470  Diastolic BP 90 77 84  Wt. (Lbs) 171 -  177.4  BMI 26 - 26.97     Physical Exam  Constitutional: He appears well-developed and well-nourished. No distress.  HENT:  Head: Normocephalic and atraumatic.  Neck: Normal range of motion. Neck supple. Carotid bruit is not present.  Cardiovascular: Normal rate, regular rhythm, normal heart sounds and intact distal pulses.   Pulmonary/Chest: Effort normal and breath sounds normal.  Genitourinary: Rectum normal and prostate normal. Rectal exam shows  guaiac negative stool.  Musculoskeletal: He exhibits no edema.  Neurological: He is alert.  Skin: Skin is warm and dry. No rash noted. No erythema.  Psychiatric: He has a normal mood and affect.   No results found for: HGBA1C  Assessment & Plan:  Morad was seen today for hypertension.  Diagnoses and all orders for this visit:  Urinary hesitancy -     Hemoccult - 1 Card (office)  Essential hypertension -     hydrALAZINE (APRESOLINE) 25 MG tablet; Take 1 tablet (25 mg total) by mouth 2 (two) times daily. -     carvedilol (COREG) 6.25 MG tablet; TAKE 1 TABLET BY MOUTH 2 TIMES DAILY WITH A MEAL. -     Lipid Panel -     HgB A1c -     CMP14+EGFR  There are no diagnoses linked to this encounter.  No orders of the defined types were placed in this encounter.   Follow-up: No Follow-up on file.   Boykin Nearing MD

## 2017-04-14 NOTE — Patient Instructions (Addendum)
Ronald Massey was seen today for hypertension.  Diagnoses and all orders for this visit:  Urinary hesitancy -     Hemoccult - 1 Card (office)  Essential hypertension -     Discontinue: hydrALAZINE (APRESOLINE) 25 MG tablet; Take 1 tablet (25 mg total) by mouth 2 (two) times daily. -     carvedilol (COREG) 6.25 MG tablet; TAKE 1 TABLET BY MOUTH 2 TIMES DAILY WITH A MEAL. -     Lipid Panel -     HgB A1c -     CMP14+EGFR -     chlorthalidone (HYGROTON) 25 MG tablet; Take 1 tablet (25 mg total) by mouth daily.  Other orders -     Tdap vaccine greater than or equal to 7yo IM  A1c is elevated  If over 6.5 x 2 you have diabetes Your are 6.6 now, Work it down with exercise, low sugar diet, avoid juice and soda (all sweet drinks)  after Ramadan  stop hydralazine Start chlorthalidone 25 mg in the morning to help reduce BP and leg swelling  with coreg 6.25 mg twice daily   F/u in 2 weeks for BP check   Dr. Adrian Blackwater

## 2017-04-14 NOTE — Assessment & Plan Note (Signed)
Normal digital rectal exam Symptoms x 2 days  Plan: Reassurance

## 2017-04-14 NOTE — Assessment & Plan Note (Signed)
A: uncontrolled HTN Med: partially compliant P: Restart coreg 6.25 mg BID  Since patient endorsing leg swelling, Stop hydralazine 25 mg nightly and replace with chlorthalidone 25 mg daily  F/u in 2 weeks for BP check

## 2017-04-14 NOTE — Assessment & Plan Note (Signed)
A1c is elevated  If over 6.5 x 2 you have diabetes Your are 6.6 now, Work it down with exercise, low sugar diet, avoid juice and soda (all sweet drinks)

## 2017-04-15 LAB — CMP14+EGFR
ALBUMIN: 4.2 g/dL (ref 3.6–4.8)
ALK PHOS: 79 IU/L (ref 39–117)
ALT: 17 IU/L (ref 0–44)
AST: 16 IU/L (ref 0–40)
Albumin/Globulin Ratio: 1.6 (ref 1.2–2.2)
BILIRUBIN TOTAL: 0.4 mg/dL (ref 0.0–1.2)
BUN / CREAT RATIO: 10 (ref 10–24)
BUN: 9 mg/dL (ref 8–27)
CHLORIDE: 100 mmol/L (ref 96–106)
CO2: 26 mmol/L (ref 20–29)
CREATININE: 0.88 mg/dL (ref 0.76–1.27)
Calcium: 9.2 mg/dL (ref 8.6–10.2)
GFR calc Af Amer: 108 mL/min/{1.73_m2} (ref >59)
GFR calc non Af Amer: 93 mL/min/{1.73_m2} (ref >59)
GLUCOSE: 125 mg/dL — AB (ref 65–99)
Globulin, Total: 2.7 g/dL (ref 1.5–4.5)
Potassium: 4.4 mmol/L (ref 3.5–5.2)
Sodium: 139 mmol/L (ref 134–144)
Total Protein: 6.9 g/dL (ref 6.0–8.5)

## 2017-04-15 LAB — LIPID PANEL
CHOL/HDL RATIO: 4 ratio (ref 0.0–5.0)
Cholesterol, Total: 172 mg/dL (ref 100–199)
HDL: 43 mg/dL (ref >39)
LDL CALC: 111 mg/dL — AB (ref 0–99)
Triglycerides: 91 mg/dL (ref 0–149)
VLDL CHOLESTEROL CAL: 18 mg/dL (ref 5–40)

## 2017-04-15 MED ORDER — ATORVASTATIN CALCIUM 20 MG PO TABS: 20.0000 mg | ORAL_TABLET | Freq: Every day | ORAL | 3 refills | Status: DC

## 2017-04-15 NOTE — Addendum Note (Signed)
Addended by: Dessa PhiFUNCHES, Clinten Howk on: 04/15/2017 10:38 PM   Modules accepted: Orders

## 2017-04-28 ENCOUNTER — Ambulatory Visit: Payer: Self-pay

## 2017-05-07 ENCOUNTER — Telehealth: Payer: Self-pay

## 2017-05-07 NOTE — Telephone Encounter (Signed)
Pt was called and informed of lab results. 

## 2017-05-11 ENCOUNTER — Other Ambulatory Visit: Payer: Self-pay | Admitting: *Deleted

## 2017-05-11 MED ORDER — TRAVOPROST (BAK FREE) 0.004 % OP SOLN: 1.0000 [drp] | Freq: Every day | OPHTHALMIC | 3 refills | Status: DC

## 2017-05-11 NOTE — Telephone Encounter (Signed)
PRINTED FOR PASS PROGRAM 

## 2017-05-20 ENCOUNTER — Ambulatory Visit: Payer: Self-pay | Attending: Family Medicine | Admitting: *Deleted

## 2017-05-20 VITALS — BP 123/75 | HR 67 | Resp 16

## 2017-05-20 DIAGNOSIS — Z0130 Encounter for examination of blood pressure without abnormal findings: Secondary | ICD-10-CM

## 2017-05-20 DIAGNOSIS — I10 Essential (primary) hypertension: Secondary | ICD-10-CM

## 2017-05-20 MED FILL — ?CARVEDILOL 6.25 MG TABLET: 6.25 | 30 days supply | Qty: 60 | Fill #1

## 2017-05-20 MED FILL — hydrALAZINE HCL 25 MG TABS: 25 | 30 days supply | Qty: 60 | Fill #1

## 2017-05-20 MED FILL — ?CHLORTHALIDONE 25 MG TABLE: 25 | 30 days supply | Qty: 30 | Fill #1

## 2017-05-20 NOTE — Progress Notes (Signed)
[facility]  alert and oriented and arrives in good spirits. Last OV June 12,2018 with  Dr. Armen PickupFunches.   Pt denies chest pain, SOB, HA, dizziness, or blurred vision. Clarified medication regimen with patient on after visit summary hand out.     Blood pressure reading: 123/75  HR: 67

## 2017-05-27 ENCOUNTER — Other Ambulatory Visit: Payer: Self-pay | Admitting: Family Medicine

## 2017-05-27 DIAGNOSIS — H401132 Primary open-angle glaucoma, bilateral, moderate stage: Secondary | ICD-10-CM

## 2017-06-02 ENCOUNTER — Ambulatory Visit: Payer: Self-pay | Admitting: Family Medicine

## 2017-06-02 ENCOUNTER — Encounter: Payer: Self-pay | Admitting: Physician Assistant

## 2017-06-02 ENCOUNTER — Ambulatory Visit: Payer: Self-pay | Attending: Family Medicine | Admitting: Physician Assistant

## 2017-06-02 VITALS — BP 124/73 | HR 76 | Temp 97.9°F | Resp 18 | Ht 68.0 in | Wt 176.0 lb

## 2017-06-02 DIAGNOSIS — R609 Edema, unspecified: Secondary | ICD-10-CM | POA: Insufficient documentation

## 2017-06-02 DIAGNOSIS — E118 Type 2 diabetes mellitus with unspecified complications: Secondary | ICD-10-CM | POA: Insufficient documentation

## 2017-06-02 DIAGNOSIS — I10 Essential (primary) hypertension: Secondary | ICD-10-CM | POA: Insufficient documentation

## 2017-06-02 DIAGNOSIS — Z7984 Long term (current) use of oral hypoglycemic drugs: Secondary | ICD-10-CM | POA: Insufficient documentation

## 2017-06-02 DIAGNOSIS — Z79899 Other long term (current) drug therapy: Secondary | ICD-10-CM | POA: Insufficient documentation

## 2017-06-02 LAB — GLUCOSE, POCT (MANUAL RESULT ENTRY): POC Glucose: 188 mg/dl — AB (ref 70–99)

## 2017-06-02 MED ORDER — METFORMIN HCL 500 MG PO TABS: 500.0000 mg | ORAL_TABLET | Freq: Two times a day (BID) | ORAL | 3 refills | Status: DC

## 2017-06-02 MED FILL — metFORMIN HCL 500 MG TABS: 500 | 30 days supply | Qty: 60 | Fill #0

## 2017-06-02 NOTE — Patient Instructions (Signed)
Diabetes Mellitus and Food It is important for you to manage your blood sugar (glucose) level. Your blood glucose level can be greatly affected by what you eat. Eating healthier foods in the appropriate amounts throughout the day at about the same time each day will help you control your blood glucose level. It can also help slow or prevent worsening of your diabetes mellitus. Healthy eating may even help you improve the level of your blood pressure and reach or maintain a healthy weight. General recommendations for healthful eating and cooking habits include:  Eating meals and snacks regularly. Avoid going long periods of time without eating to lose weight.  Eating a diet that consists mainly of plant-based foods, such as fruits, vegetables, nuts, legumes, and whole grains.  Using low-heat cooking methods, such as baking, instead of high-heat cooking methods, such as deep frying.  Work with your dietitian to make sure you understand how to use the Nutrition Facts information on food labels. How can food affect me? Carbohydrates Carbohydrates affect your blood glucose level more than any other type of food. Your dietitian will help you determine how many carbohydrates to eat at each meal and teach you how to count carbohydrates. Counting carbohydrates is important to keep your blood glucose at a healthy level, especially if you are using insulin or taking certain medicines for diabetes mellitus. Alcohol Alcohol can cause sudden decreases in blood glucose (hypoglycemia), especially if you use insulin or take certain medicines for diabetes mellitus. Hypoglycemia can be a life-threatening condition. Symptoms of hypoglycemia (sleepiness, dizziness, and disorientation) are similar to symptoms of having too much alcohol. If your health care provider has given you approval to drink alcohol, do so in moderation and use the following guidelines:  Women should not have more than one drink per day, and men  should not have more than two drinks per day. One drink is equal to: ? 12 oz of beer. ? 5 oz of wine. ? 1 oz of hard liquor.  Do not drink on an empty stomach.  Keep yourself hydrated. Have water, diet soda, or unsweetened iced tea.  Regular soda, juice, and other mixers might contain a lot of carbohydrates and should be counted.  What foods are not recommended? As you make food choices, it is important to remember that all foods are not the same. Some foods have fewer nutrients per serving than other foods, even though they might have the same number of calories or carbohydrates. It is difficult to get your body what it needs when you eat foods with fewer nutrients. Examples of foods that you should avoid that are high in calories and carbohydrates but low in nutrients include:  Trans fats (most processed foods list trans fats on the Nutrition Facts label).  Regular soda.  Juice.  Candy.  Sweets, such as cake, pie, doughnuts, and cookies.  Fried foods.  What foods can I eat? Eat nutrient-rich foods, which will nourish your body and keep you healthy. The food you should eat also will depend on several factors, including:  The calories you need.  The medicines you take.  Your weight.  Your blood glucose level.  Your blood pressure level.  Your cholesterol level.  You should eat a variety of foods, including:  Protein. ? Lean cuts of meat. ? Proteins low in saturated fats, such as fish, egg whites, and beans. Avoid processed meats.  Fruits and vegetables. ? Fruits and vegetables that may help control blood glucose levels, such as apples,   mangoes, and yams.  Dairy products. ? Choose fat-free or low-fat dairy products, such as milk, yogurt, and cheese.  Grains, bread, pasta, and rice. ? Choose whole grain products, such as multigrain bread, whole oats, and brown rice. These foods may help control blood pressure.  Fats. ? Foods containing healthful fats, such as  nuts, avocado, olive oil, canola oil, and fish.  Does everyone with diabetes mellitus have the same meal plan? Because every person with diabetes mellitus is different, there is not one meal plan that works for everyone. It is very important that you meet with a dietitian who will help you create a meal plan that is just right for you. This information is not intended to replace advice given to you by your health care provider. Make sure you discuss any questions you have with your health care provider. Document Released: 07/17/2005 Document Revised: 03/27/2016 Document Reviewed: 09/16/2013 Elsevier Interactive Patient Education  2017 [employer]  

## 2017-06-02 NOTE — Progress Notes (Signed)
Ronald Massey Lavoy, is a 61 y.o. male  [id number]  [id number]  DOB - 03/06/1956  Subjective:  Chief Complaint and HPI: Ronald Massey Bisch is a 61 y.o. male here today c/o lower leg swelling now for a few months.  He is a taxi-cab driver.  The swelling only occurs on the days he is working.  Improves after rest and sleep.  Lifestyle is fairly sedentary.  He denies CP/SOB.   He denies s/sx of hyper/hypoglycemia.  He has supposed to have been working on diet to help with blood sugar but admits to still eating a lot of sugar  Social History:  Media plannerTaxi driver, married  ROS:   Constitutional:  No f/c, No night sweats, No unexplained weight loss. EENT:  No vision changes, No blurry vision, No hearing changes. No mouth, throat, or ear problems.  Respiratory: No cough, No SOB Cardiac: No CP, no palpitations GI:  No abd pain, No N/V/D. GU: No Urinary s/sx Musculoskeletal: No joint pain Neuro: No headache, no dizziness, no motor weakness.  Skin: No rash Endocrine:  No polydipsia. No polyuria.  Psych: Denies SI/HI  Problem  DM (Diabetes Mellitus) (Hcc)    ALLERGIES: No Known Allergies  PAST MEDICAL HISTORY: Past Medical History:  Diagnosis Date  . Hypertension   . Pollen allergies     MEDICATIONS AT HOME: Prior to Admission medications   Medication Sig Start Date End Date Taking? Authorizing Provider  atorvastatin (LIPITOR) 20 MG tablet Take 1 tablet (20 mg total) by mouth daily. 04/15/17  Yes Funches, Josalyn, MD  carvedilol (COREG) 6.25 MG tablet TAKE 1 TABLET BY MOUTH 2 TIMES DAILY WITH A MEAL. 04/14/17  Yes Funches, Josalyn, MD  cetirizine (ZYRTEC) 10 MG tablet Take 10 mg by mouth daily.   Yes [provider]  chlorthalidone (HYGROTON) 25 MG tablet Take 1 tablet (25 mg total) by mouth daily. 04/14/17  Yes Funches, Josalyn, MD  TRAVATAN Z 0.004 % SOLN ophthalmic solution PLACE 1 DROP INTO BOTH EYES AT BEDTIME. 05/28/17  Yes Funches, Josalyn, MD  metFORMIN (GLUCOPHAGE) 500 MG  tablet Take 1 tablet (500 mg total) by mouth 2 (two) times daily with a meal. 06/02/17   McClung, Marzella SchleinAngela M, PA-C     Objective:  EXAM:   Vitals:   06/02/17 1136  BP: 124/73  Pulse: 76  Resp: 18  Temp: 97.9 F (36.6 C)  TempSrc: Oral  SpO2: 99%  Weight: 176 lb (79.8 kg)  Height: 5\' 8"  (1.727 m)    General appearance : A&OX3. NAD. Non-toxic-appearing HEENT: Atraumatic and Normocephalic.  PERRLA. EOM intact. Neck: supple, no JVD. No cervical lymphadenopathy. No thyromegaly Chest/Lungs:  Breathing-non-labored, Good air entry bilaterally, breath sounds normal without rales, rhonchi, or wheezing  CVS: S1 S2 regular, no murmurs, gallops, rub Extremities: Bilateral Lower Ext shows minimal edema B, No calf swelling or TTP.  Neg Homan's. both legs are warm to touch with = pulse throughout Neurology:  CN II-XII grossly intact, Non focal.   Psych:  TP linear. J/I WNL. Normal speech. Appropriate eye contact and affect.  Skin:  No Rash  Data Review Lab Results  Component Value Date   HGBA1C 6.6 04/14/2017     Assessment & Plan   1. Edema, unspecified type Increase activity, increase water intake.  Decrease salt and sugar.  Elevate legs at least 1 hr/day.  Check BMP  2. Essential hypertension Controlled.  Continue current regimen - Basic metabolic panel  3. Type 2 diabetes mellitus with complication, without long-term  current use of insulin (HCC) New diagnosis - Glucose (CBG) start- metFORMIN (GLUCOPHAGE) 500 MG tablet; Take 1 tablet (500 mg total) by mouth 2 (two) times daily with a meal.  Dispense: 180 tablet; Refill: 3 - Basic metabolic panel I have had a lengthy discussion and provided education about insulin resistance and the intake of too much sugar/refined carbohydrates.  I have advised the patient to work at a goal of eliminating sugary drinks, candy, desserts, sweets, refined sugars, processed foods, and white carbohydrates.  The patient expresses understanding.  He  wants to work on diet and exercise and possibly come back off meds which is likely doable with enough lifestyle changes.     Patient have been counseled extensively about nutrition and exercise  Return in about 2 months (around 08/02/2017) for assign PCP; check up DM and htn.  The patient was given clear instructions to go to ER or return to medical center if symptoms don't improve, worsen or new problems develop. The patient verbalized understanding. The patient was told to call to get lab results if they haven't heard anything in the next week.     Georgian CoAngela McClung, PA-C [facility] [place], [place] [phone]   06/02/2017, 11:50 AM

## 2017-06-03 LAB — BASIC METABOLIC PANEL
BUN/Creatinine Ratio: 13 (ref 10–24)
BUN: 12 mg/dL (ref 8–27)
CALCIUM: 9 mg/dL (ref 8.6–10.2)
CO2: 23 mmol/L (ref 20–29)
CREATININE: 0.96 mg/dL (ref 0.76–1.27)
Chloride: 102 mmol/L (ref 96–106)
GFR calc Af Amer: 99 mL/min/{1.73_m2} (ref >59)
GFR calc non Af Amer: 86 mL/min/{1.73_m2} (ref >59)
GLUCOSE: 167 mg/dL — AB (ref 65–99)
Potassium: 4.3 mmol/L (ref 3.5–5.2)
SODIUM: 141 mmol/L (ref 134–144)

## 2017-06-08 ENCOUNTER — Ambulatory Visit: Payer: Self-pay

## 2017-06-12 ENCOUNTER — Telehealth: Payer: Self-pay

## 2017-06-12 NOTE — Telephone Encounter (Signed)
Contacted pt to go over lab results pt didn't answer lvm asking pt to give me a call at his earliest convenience  If pt calls back please give results: Other than his blood sugar being high(as it was in the office), his other labs including kidney function and electrolytes are normal. Elevate legs for swelling, increase exercise, and take new medications as we discussed. Follow-up as planned.

## 2017-06-19 ENCOUNTER — Telehealth: Payer: Self-pay | Admitting: *Deleted

## 2017-06-19 NOTE — Telephone Encounter (Signed)
-----   Message from Anders Simmonds, [place] sent at 06/04/2017  9:02 AM EDT ----- Please call patient.  Other than his blood sugar being high(as it was in the office), his other labs including kidney function and electrolytes are normal.  Elevate legs for swelling, increase exercise, and take new medications as we discussed.  Follow-up as planned.  Thanks, Georgian Co, PA-C

## 2017-06-19 NOTE — Telephone Encounter (Signed)
Patient verified DOB Patient is aware of blood suger reporting high as it was in the office. Patient is also aware of all other labs being normal and needing to elevate is legs to reduce swelling. No further questions at this time.

## 2017-07-01 MED FILL — ?CARVEDILOL 6.25 MG TABLET: 6.25 | 30 days supply | Qty: 60 | Fill #2

## 2017-07-01 MED FILL — ?METFORMIN HCL 500MG TABLET: 500 | 30 days supply | Qty: 60 | Fill #1

## 2017-07-01 MED FILL — hydrALAZINE HCL 25 MG TABS: 25 | 30 days supply | Qty: 60 | Fill #2

## 2017-07-01 MED FILL — ?CHLORTHALIDONE 25 MG TABLE: 25 | 30 days supply | Qty: 30 | Fill #2

## 2017-07-20 ENCOUNTER — Ambulatory Visit: Payer: Self-pay

## 2017-08-03 ENCOUNTER — Ambulatory Visit: Payer: Self-pay | Admitting: Family Medicine

## 2017-09-11 ENCOUNTER — Ambulatory Visit: Payer: Self-pay | Attending: Internal Medicine

## 2017-09-11 IMAGING — CT CT RENAL STONE PROTOCOL
2 of 4 series · 16 of 46 positions shown, 18 images · non-contrast
Comparison: None.

CLINICAL DATA: Right flank pain since yesterday.

EXAM:
CT ABDOMEN AND PELVIS WITHOUT CONTRAST
TECHNIQUE: Multidetector CT imaging of the abdomen and pelvis was performed
following the standard protocol without IV contrast.

[Series 2: renal stone 5mm · axial · 0.67mm/px · z∈[+777,+1237]mm · 13 of 101 slices shown, 15 images]
[im 5/101  soft-tissue]
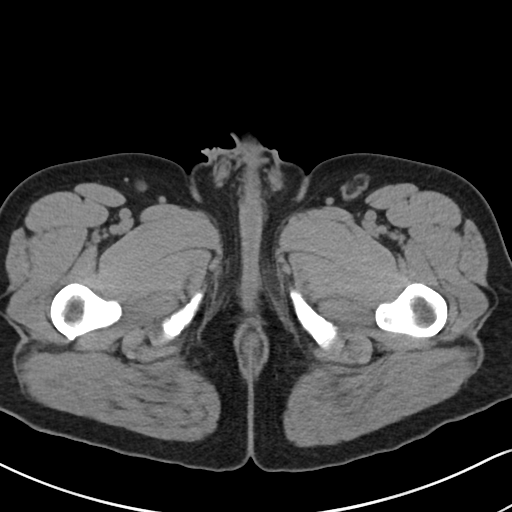
[im 5/101  bone]
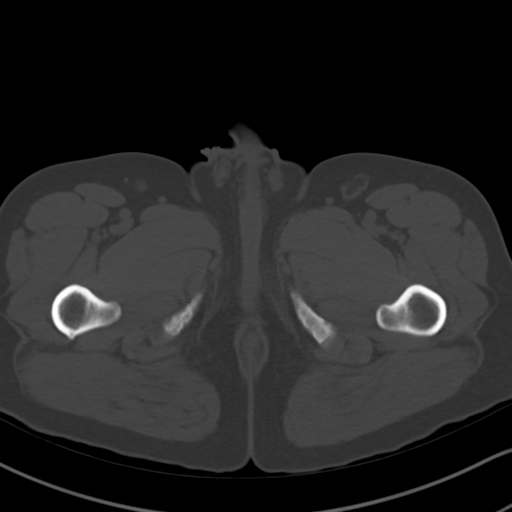
[im 13/101  soft-tissue]
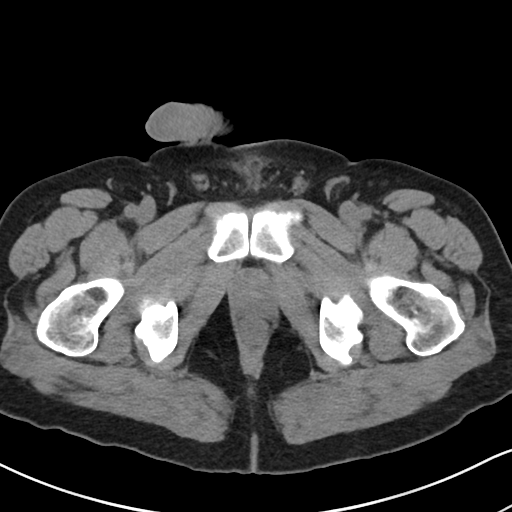
[im 21/101  soft-tissue]
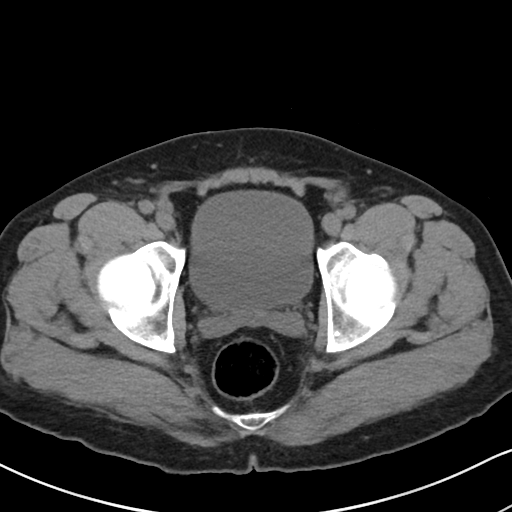
[im 29/101  soft-tissue]
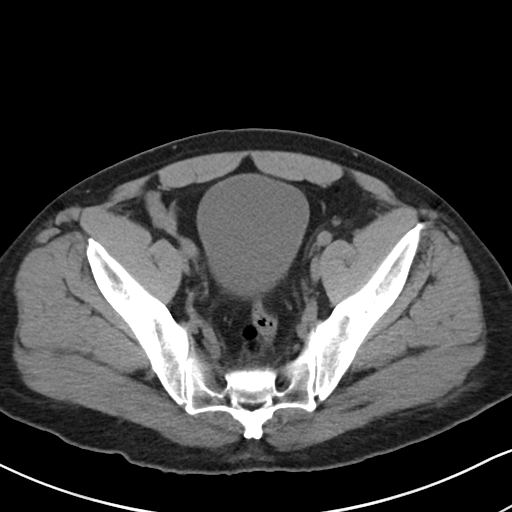
[im 37/101  soft-tissue]
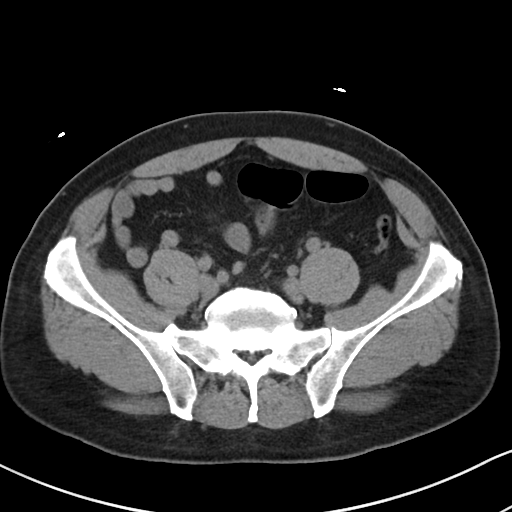
[im 45/101  soft-tissue]
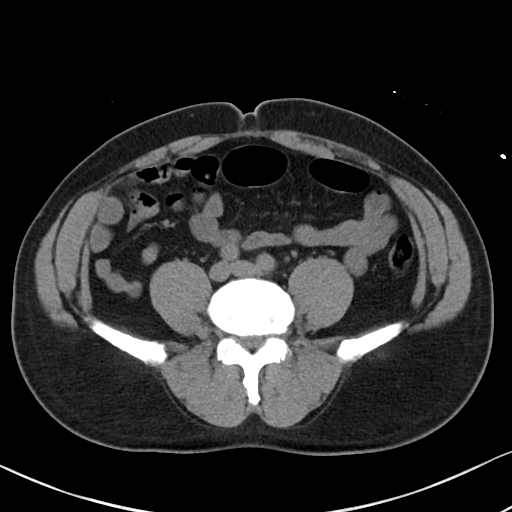
[im 53/101  soft-tissue]
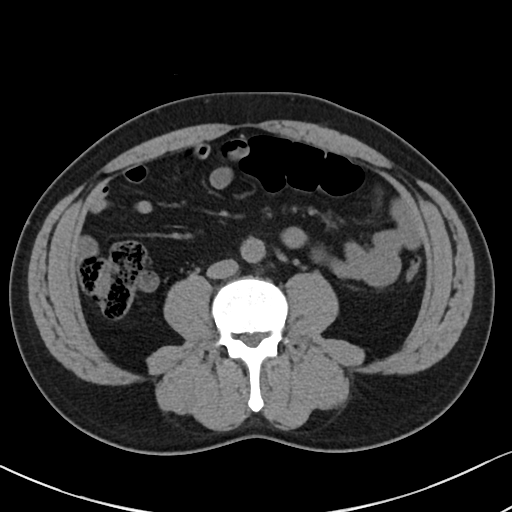
[im 57/101  soft-tissue]
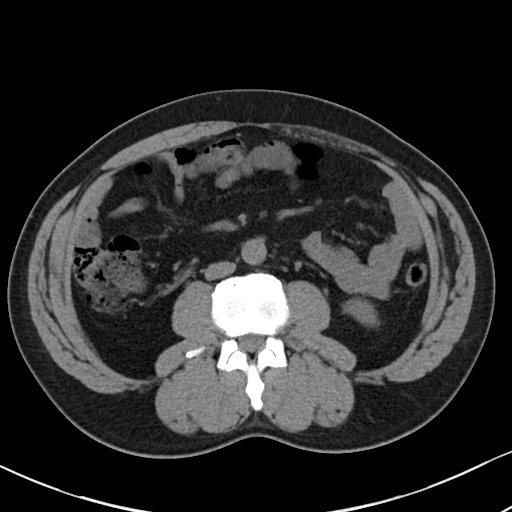
[im 65/101  soft-tissue]
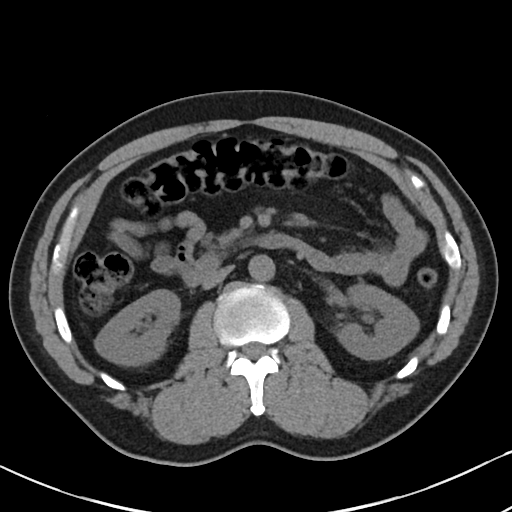
[im 65/101  bone]
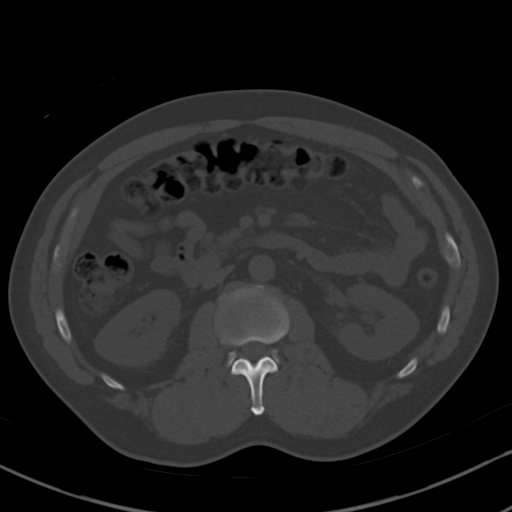
[im 73/101  soft-tissue]
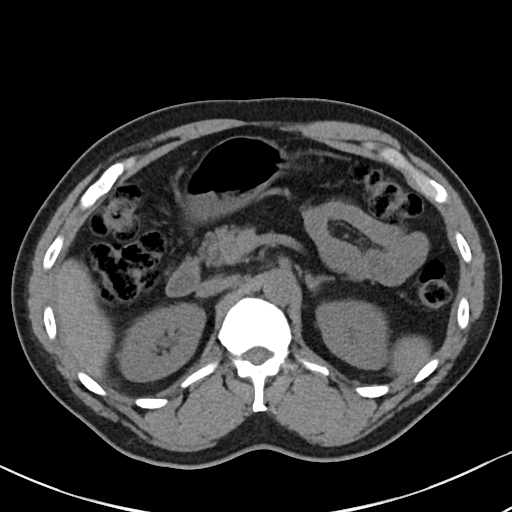
[im 81/101  soft-tissue]
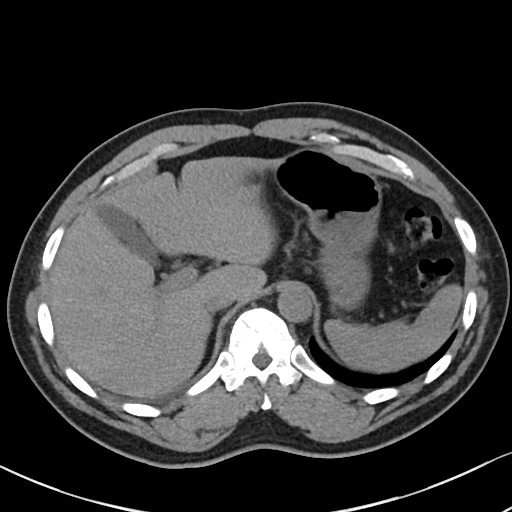
[im 89/101  soft-tissue]
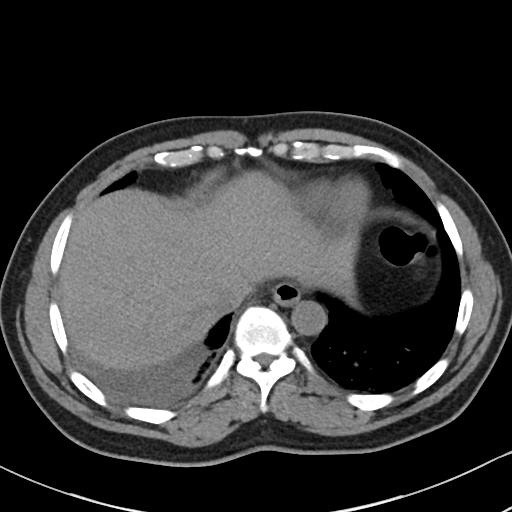
[im 97/101  soft-tissue]
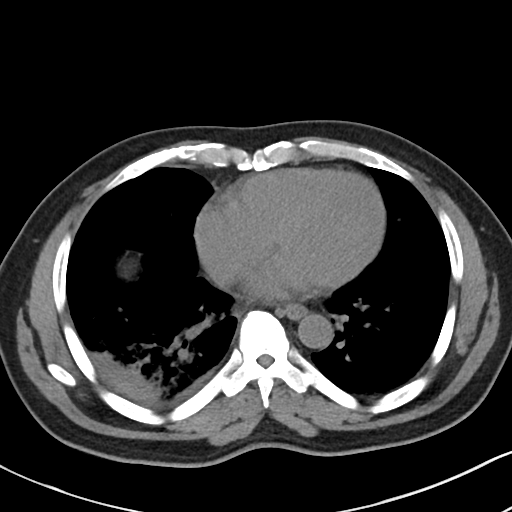

[Series 5: renal stone 3.0 cor · coronal · 0.69mm/px · 3 of 78 slices shown]
[im 26/78  soft-tissue]
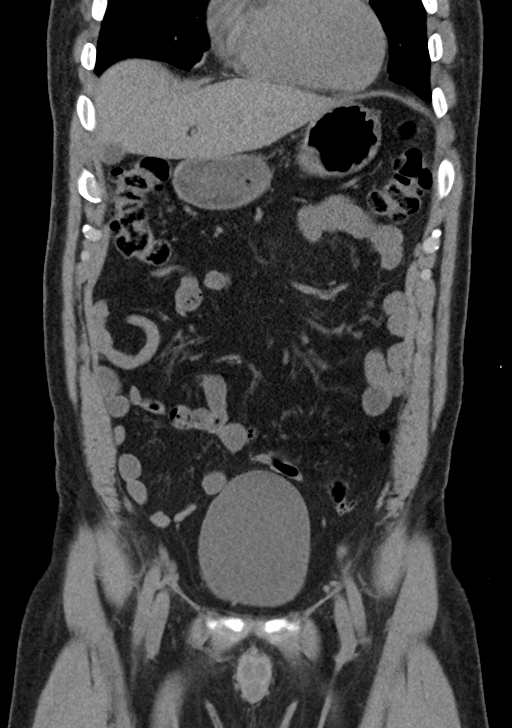
[im 35/78  soft-tissue]
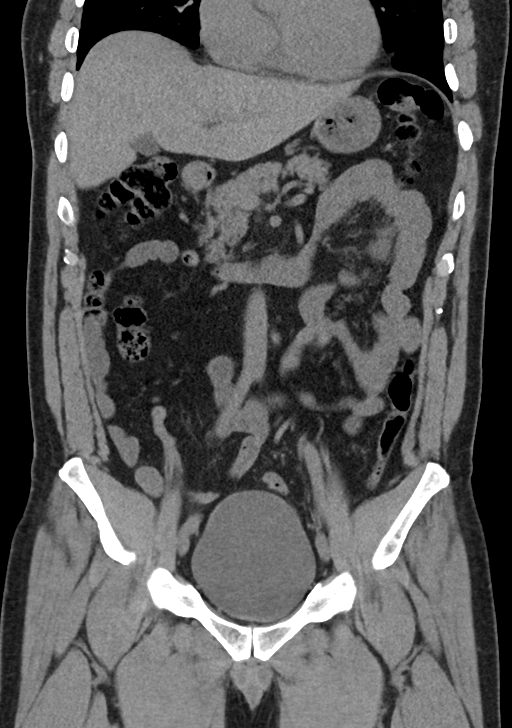
[im 43/78  soft-tissue]
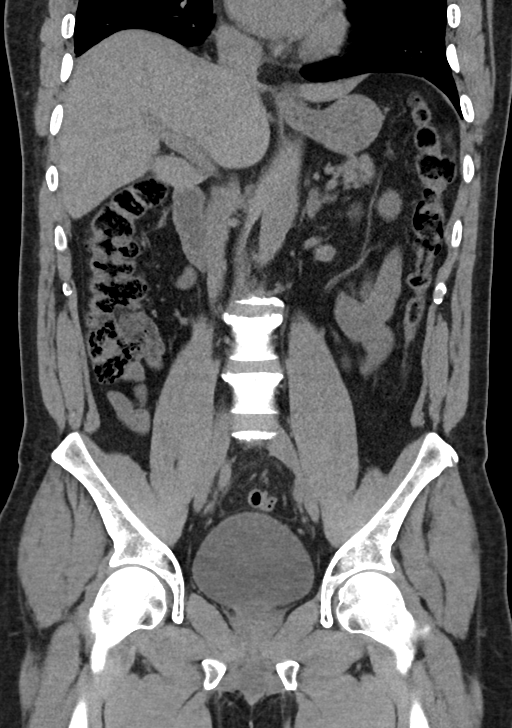

[16 of 46 positions shown; findings below may reference images not displayed]

FINDINGS: Lung bases: Small right pleural effusion. There is dependent right
lower lobe opacity. Although this could all be atelectasis,
pneumonia is suspected. Heart normal in size.

Liver, spleen, gallbladder, pancreas, adrenal glands:  Normal.

Kidneys, ureters, bladder:  Normal.

Lymph nodes:  No adenopathy.

Ascites: None.

Gastrointestinal: The mid portion of the appendix is dilated to 1
cm. There are no adjacent inflammatory changes, however. Stomach,
colon and small bowel are unremarkable.

Musculoskeletal: Minor endplate spurring noted along the visualized
spine. Otherwise unremarkable.
IMPRESSION: 1. Small right pleural effusion with associated dependent right
lower lobe opacity. Pneumonia suspected.
2. Appendix is dilated in its midportion, but there is no associated
inflammation. This may reflect a normal variant in this patient. If
symptoms suggest appendicitis, however, short-term follow-up CT
imaging in 12-24 hours to reassess the appendix would be
recommended.
3. No other abnormalities.

## 2017-09-11 MED FILL — $Travatan Z 0.004% Eye Drop: 0.004 | 30 days supply | Qty: 5 | Fill #0

## 2017-09-11 MED FILL — ?METFORMIN HCL 500MG TABLET: 500 | 30 days supply | Qty: 60 | Fill #2

## 2017-09-16 ENCOUNTER — Ambulatory Visit: Payer: Self-pay | Attending: Nurse Practitioner | Admitting: Nurse Practitioner

## 2017-09-16 ENCOUNTER — Encounter: Payer: Self-pay | Admitting: Nurse Practitioner

## 2017-09-16 VITALS — BP 126/76 | HR 93 | Temp 98.1°F | Resp 18 | Ht 68.0 in | Wt 173.4 lb

## 2017-09-16 DIAGNOSIS — E785 Hyperlipidemia, unspecified: Secondary | ICD-10-CM | POA: Insufficient documentation

## 2017-09-16 DIAGNOSIS — I10 Essential (primary) hypertension: Secondary | ICD-10-CM | POA: Insufficient documentation

## 2017-09-16 DIAGNOSIS — E118 Type 2 diabetes mellitus with unspecified complications: Secondary | ICD-10-CM

## 2017-09-16 DIAGNOSIS — Z79899 Other long term (current) drug therapy: Secondary | ICD-10-CM | POA: Insufficient documentation

## 2017-09-16 DIAGNOSIS — E1169 Type 2 diabetes mellitus with other specified complication: Secondary | ICD-10-CM

## 2017-09-16 DIAGNOSIS — Z7984 Long term (current) use of oral hypoglycemic drugs: Secondary | ICD-10-CM | POA: Insufficient documentation

## 2017-09-16 DIAGNOSIS — E11319 Type 2 diabetes mellitus with unspecified diabetic retinopathy without macular edema: Secondary | ICD-10-CM | POA: Insufficient documentation

## 2017-09-16 DIAGNOSIS — H401132 Primary open-angle glaucoma, bilateral, moderate stage: Secondary | ICD-10-CM

## 2017-09-16 DIAGNOSIS — Z23 Encounter for immunization: Secondary | ICD-10-CM | POA: Insufficient documentation

## 2017-09-16 DIAGNOSIS — H409 Unspecified glaucoma: Secondary | ICD-10-CM | POA: Insufficient documentation

## 2017-09-16 LAB — GLUCOSE, POCT (MANUAL RESULT ENTRY): POC Glucose: 142 mg/dl — AB (ref 70–99)

## 2017-09-16 LAB — POCT GLYCOSYLATED HEMOGLOBIN (HGB A1C): Hemoglobin A1C: 6.4

## 2017-09-16 MED ORDER — ATORVASTATIN CALCIUM 20 MG PO TABS: 20.0000 mg | ORAL_TABLET | Freq: Every day | ORAL | 3 refills | Status: DC

## 2017-09-16 MED ORDER — METFORMIN HCL 500 MG PO TABS: 500.0000 mg | ORAL_TABLET | Freq: Two times a day (BID) | ORAL | 3 refills | Status: DC

## 2017-09-16 MED ORDER — TRAVOPROST (BAK FREE) 0.004 % OP SOLN: 1.0000 [drp] | Freq: Every day | OPHTHALMIC | 2 refills | Status: DC

## 2017-09-16 MED ORDER — CHLORTHALIDONE 25 MG PO TABS: 25.0000 mg | ORAL_TABLET | Freq: Every day | ORAL | 2 refills | Status: DC

## 2017-09-16 MED ORDER — CARVEDILOL 6.25 MG PO TABS: ORAL_TABLET | ORAL | 2 refills | Status: DC

## 2017-09-16 MED FILL — ?CARVEDILOL 6.25 MG TABLET: 6.25 | 30 days supply | Qty: 60 | Fill #0

## 2017-09-16 MED FILL — ATORVASTATIN 20 MG TABLET: 20 | 30 days supply | Qty: 30 | Fill #0

## 2017-09-16 MED FILL — ?CHLORTHALIDONE 25 MG TABLE: 25 | 30 days supply | Qty: 30 | Fill #0

## 2017-09-16 NOTE — Patient Instructions (Addendum)
DASH Eating Plan DASH stands for "Dietary Approaches to Stop Hypertension." The DASH eating plan is a healthy eating plan that has been shown to reduce high blood pressure (hypertension). It may also reduce your risk for type 2 diabetes, heart disease, and stroke. The DASH eating plan may also help with weight loss. What are tips for following this plan? General guidelines  Avoid eating more than 2,300 mg (milligrams) of salt (sodium) a day. If you have hypertension, you may need to reduce your sodium intake to 1,500 mg a day.  Limit alcohol intake to no more than 1 drink a day for nonpregnant women and 2 drinks a day for men. One drink equals 12 oz of beer, 5 oz of wine, or 1 oz of hard liquor.  Work with your health care provider to maintain a healthy body weight or to lose weight. Ask what an ideal weight is for you.  Get at least 30 minutes of exercise that causes your heart to beat faster (aerobic exercise) most days of the week. Activities may include walking, swimming, or biking.  Work with your health care provider or diet and nutrition specialist (dietitian) to adjust your eating plan to your individual calorie needs. Reading food labels  Check food labels for the amount of sodium per serving. Choose foods with less than 5 percent of the Daily Value of sodium. Generally, foods with less than 300 mg of sodium per serving fit into this eating plan.  To find whole grains, look for the word "whole" as the first word in the ingredient list. Shopping  Buy products labeled as "low-sodium" or "no salt added."  Buy fresh foods. Avoid canned foods and premade or frozen meals. Cooking  Avoid adding salt when cooking. Use salt-free seasonings or herbs instead of table salt or sea salt. Check with your health care provider or pharmacist before using salt substitutes.  Do not fry foods. Cook foods using healthy methods such as baking, boiling, grilling, and broiling instead.  Cook with  heart-healthy oils, such as olive, canola, soybean, or sunflower oil. Meal planning   Eat a balanced diet that includes: ? 5 or more servings of fruits and vegetables each day. At each meal, try to fill half of your plate with fruits and vegetables. ? Up to 6-8 servings of whole grains each day. ? Less than 6 oz of lean meat, poultry, or fish each day. A 3-oz serving of meat is about the same size as a deck of cards. One egg equals 1 oz. ? 2 servings of low-fat dairy each day. ? A serving of nuts, seeds, or beans 5 times each week. ? Heart-healthy fats. Healthy fats called Omega-3 fatty acids are found in foods such as flaxseeds and coldwater fish, like sardines, salmon, and mackerel.  Limit how much you eat of the following: ? Canned or prepackaged foods. ? Food that is high in trans fat, such as fried foods. ? Food that is high in saturated fat, such as fatty meat. ? Sweets, desserts, sugary drinks, and other foods with added sugar. ? Full-fat dairy products.  Do not salt foods before eating.  Try to eat at least 2 vegetarian meals each week.  Eat more home-cooked food and less restaurant, buffet, and fast food.  When eating at a restaurant, ask that your food be prepared with less salt or no salt, if possible. What foods are recommended? The items listed may not be a complete list. Talk with your dietitian about what   dietary choices are best for you. Grains Whole-grain or whole-wheat bread. Whole-grain or whole-wheat pasta. Brown rice. Oatmeal. Quinoa. Bulgur. Whole-grain and low-sodium cereals. Pita bread. Low-fat, low-sodium crackers. Whole-wheat flour tortillas. Vegetables Fresh or frozen vegetables (raw, steamed, roasted, or grilled). Low-sodium or reduced-sodium tomato and vegetable juice. Low-sodium or reduced-sodium tomato sauce and tomato paste. Low-sodium or reduced-sodium canned vegetables. Fruits All fresh, dried, or frozen fruit. Canned fruit in natural juice (without  added sugar). Meat and other protein foods Skinless chicken or turkey. Ground chicken or turkey. Pork with fat trimmed off. Fish and seafood. Egg whites. Dried beans, peas, or lentils. Unsalted nuts, nut butters, and seeds. Unsalted canned beans. Lean cuts of beef with fat trimmed off. Low-sodium, lean deli meat. Dairy Low-fat (1%) or fat-free (skim) milk. Fat-free, low-fat, or reduced-fat cheeses. Nonfat, low-sodium ricotta or cottage cheese. Low-fat or nonfat yogurt. Low-fat, low-sodium cheese. Fats and oils Soft margarine without trans fats. Vegetable oil. Low-fat, reduced-fat, or light mayonnaise and salad dressings (reduced-sodium). Canola, safflower, olive, soybean, and sunflower oils. Avocado. Seasoning and other foods Herbs. Spices. Seasoning mixes without salt. Unsalted popcorn and pretzels. Fat-free sweets. What foods are not recommended? The items listed may not be a complete list. Talk with your dietitian about what dietary choices are best for you. Grains Baked goods made with fat, such as croissants, muffins, or some breads. Dry pasta or rice meal packs. Vegetables Creamed or fried vegetables. Vegetables in a cheese sauce. Regular canned vegetables (not low-sodium or reduced-sodium). Regular canned tomato sauce and paste (not low-sodium or reduced-sodium). Regular tomato and vegetable juice (not low-sodium or reduced-sodium). Pickles. Olives. Fruits Canned fruit in a light or heavy syrup. Fried fruit. Fruit in cream or butter sauce. Meat and other protein foods Fatty cuts of meat. Ribs. Fried meat. Bacon. Sausage. Bologna and other processed lunch meats. Salami. Fatback. Hotdogs. Bratwurst. Salted nuts and seeds. Canned beans with added salt. Canned or smoked fish. Whole eggs or egg yolks. Chicken or turkey with skin. Dairy Whole or 2% milk, cream, and half-and-half. Whole or full-fat cream cheese. Whole-fat or sweetened yogurt. Full-fat cheese. Nondairy creamers. Whipped toppings.  Processed cheese and cheese spreads. Fats and oils Butter. Stick margarine. Lard. Shortening. Ghee. Bacon fat. Tropical oils, such as coconut, palm kernel, or palm oil. Seasoning and other foods Salted popcorn and pretzels. Onion salt, garlic salt, seasoned salt, table salt, and sea salt. Worcestershire sauce. Tartar sauce. Barbecue sauce. Teriyaki sauce. Soy sauce, including reduced-sodium. Steak sauce. Canned and packaged gravies. Fish sauce. Oyster sauce. Cocktail sauce. Horseradish that you find on the shelf. Ketchup. Mustard. Meat flavorings and tenderizers. Bouillon cubes. Hot sauce and Tabasco sauce. Premade or packaged marinades. Premade or packaged taco seasonings. Relishes. Regular salad dressings. Where to find more information:  National Heart, Lung, and Blood Institute: www.nhlbi.nih.gov  American Heart Association: www.heart.org Summary  The DASH eating plan is a healthy eating plan that has been shown to reduce high blood pressure (hypertension). It may also reduce your risk for type 2 diabetes, heart disease, and stroke.  With the DASH eating plan, you should limit salt (sodium) intake to 2,300 mg a day. If you have hypertension, you may need to reduce your sodium intake to 1,500 mg a day.  When on the DASH eating plan, aim to eat more fresh fruits and vegetables, whole grains, lean proteins, low-fat dairy, and heart-healthy fats.  Work with your health care provider or diet and nutrition specialist (dietitian) to adjust your eating plan to your individual   calorie needs. This information is not intended to replace advice given to you by your health care provider. Make sure you discuss any questions you have with your health care provider. Document Released: 10/09/2011 Document Revised: 10/13/2016 Document Reviewed: 10/13/2016 Elsevier Interactive Patient Education  2017 [employer]  Managing Your Hypertension Hypertension is commonly called high blood pressure. This is when  the force of your blood pressing against the walls of your arteries is too strong. Arteries are blood vessels that carry blood from your heart throughout your body. Hypertension forces the heart to work harder to pump blood, and may cause the arteries to become narrow or stiff. Having untreated or uncontrolled hypertension can cause heart attack, stroke, kidney disease, and other problems. What are blood pressure readings? A blood pressure reading consists of a higher number over a lower number. Ideally, your blood pressure should be below 120/80. The first ("top") number is called the systolic pressure. It is a measure of the pressure in your arteries as your heart beats. The second ("bottom") number is called the diastolic pressure. It is a measure of the pressure in your arteries as the heart relaxes. What does my blood pressure reading mean? Blood pressure is classified into four stages. Based on your blood pressure reading, your health care provider may use the following stages to determine what type of treatment you need, if any. Systolic pressure and diastolic pressure are measured in a unit called mm Hg. Normal  Systolic pressure: below 120.  Diastolic pressure: below 80. Elevated  Systolic pressure: 120-129.  Diastolic pressure: below 80. Hypertension stage 1  Systolic pressure: 130-139.  Diastolic pressure: 80-89. Hypertension stage 2  Systolic pressure: 140 or above.  Diastolic pressure: 90 or above. What health risks are associated with hypertension? Managing your hypertension is an important responsibility. Uncontrolled hypertension can lead to:  A heart attack.  A stroke.  A weakened blood vessel (aneurysm).  Heart failure.  Kidney damage.  Eye damage.  Metabolic syndrome.  Memory and concentration problems.  What changes can I make to manage my hypertension? Hypertension can be managed by making lifestyle changes and possibly by taking medicines. Your  health care provider will help you make a plan to bring your blood pressure within a normal range. Eating and drinking  Eat a diet that is high in fiber and potassium, and low in salt (sodium), added sugar, and fat. An example eating plan is called the DASH (Dietary Approaches to Stop Hypertension) diet. To eat this way: ? Eat plenty of fresh fruits and vegetables. Try to fill half of your plate at each meal with fruits and vegetables. ? Eat whole grains, such as whole wheat pasta, brown rice, or whole grain bread. Fill about one quarter of your plate with whole grains. ? Eat low-fat diary products. ? Avoid fatty cuts of meat, processed or cured meats, and poultry with skin. Fill about one quarter of your plate with lean proteins such as fish, chicken without skin, beans, eggs, and tofu. ? Avoid premade and processed foods. These tend to be higher in sodium, added sugar, and fat.  Reduce your daily sodium intake. Most people with hypertension should eat less than 1,500 mg of sodium a day.  Limit alcohol intake to no more than 1 drink a day for nonpregnant women and 2 drinks a day for men. One drink equals 12 oz of beer, 5 oz of wine, or 1 oz of hard liquor. Lifestyle  Work with your health care provider   to maintain a healthy body weight, or to lose weight. Ask what an ideal weight is for you.  Get at least 30 minutes of exercise that causes your heart to beat faster (aerobic exercise) most days of the week. Activities may include walking, swimming, or biking.  Include exercise to strengthen your muscles (resistance exercise), such as weight lifting, as part of your weekly exercise routine. Try to do these types of exercises for 30 minutes at least 3 days a week.  Do not use any products that contain nicotine or tobacco, such as cigarettes and e-cigarettes. If you need help quitting, ask your health care provider.  Control any long-term (chronic) conditions you have, such as high cholesterol  or diabetes. Monitoring  Monitor your blood pressure at home as told by your health care provider. Your personal target blood pressure may vary depending on your medical conditions, your age, and other factors.  Have your blood pressure checked regularly, as often as told by your health care provider. Working with your health care provider  Review all the medicines you take with your health care provider because there may be side effects or interactions.  Talk with your health care provider about your diet, exercise habits, and other lifestyle factors that may be contributing to hypertension.  Visit your health care provider regularly. Your health care provider can help you create and adjust your plan for managing hypertension. Will I need medicine to control my blood pressure? Your health care provider may prescribe medicine if lifestyle changes are not enough to get your blood pressure under control, and if:  Your systolic blood pressure is 130 or higher.  Your diastolic blood pressure is 80 or higher.  Take medicines only as told by your health care provider. Follow the directions carefully. Blood pressure medicines must be taken as prescribed. The medicine does not work as well when you skip doses. Skipping doses also puts you at risk for problems. Contact a health care provider if:  You think you are having a reaction to medicines you have taken.  You have repeated (recurrent) headaches.  You feel dizzy.  You have swelling in your ankles.  You have trouble with your vision. Get help right away if:  You develop a severe headache or confusion.  You have unusual weakness or numbness, or you feel faint.  You have severe pain in your chest or abdomen.  You vomit repeatedly.  You have trouble breathing. Summary  Hypertension is when the force of blood pumping through your arteries is too strong. If this condition is not controlled, it may put you at risk for serious  complications.  Your personal target blood pressure may vary depending on your medical conditions, your age, and other factors. For most people, a normal blood pressure is less than 120/80.  Hypertension is managed by lifestyle changes, medicines, or both. Lifestyle changes include weight loss, eating a healthy, low-sodium diet, exercising more, and limiting alcohol. This information is not intended to replace advice given to you by your health care provider. Make sure you discuss any questions you have with your health care provider. Document Released: 07/14/2012 Document Revised: 09/17/2016 Document Reviewed: 09/17/2016 Elsevier Interactive Patient Education  2018 [employer].  Screening for Type 2 Diabetes A screening test for type 2 diabetes (type 2 diabetes mellitus) is a blood test to measure your blood sugar (glucose) level. This test is done to check for early signs of diabetes, before you develop symptoms. Type 2 diabetes is a  long-term (chronic) disease that occurs when the pancreas does not make enough of a hormone called insulin. This results in high blood glucose levels, which can cause many complications. You may be screened for type 2 diabetes as part of your regular health care, especially if you have a high risk for diabetes. Screening can help identify type 2 diabetes at its early stage (prediabetes). Identifying and treating prediabetes may delay or prevent development of type 2 diabetes. What are the risk factors for type 2 diabetes? The following factors may make you more likely to develop type 2 diabetes:  Having a parent or sibling (first-degree relative) who has diabetes.  Being overweight or obese.  Being of American-Indian, [place]er, Hispanic, Latino, Asian, or African-American descent.  Not getting enough exercise.  Being older than 45.  Having a history of diabetes during pregnancy (gestational diabetes).  Having low levels of good cholesterol (HDL-C)  or high levels of blood fats (triglycerides).  Having high blood glucose in a previous blood test.  Having high blood pressure.  Having certain diseases or conditions, including: ? Acanthosis nigricans. This is a condition that causes dark skin on the neck, armpits, and groin. ? Polycystic ovary syndrome (PCOS). ? Heart disease.  Having delivered a baby who weighed more than 9 lb (4.1 kg).  Who should be screened for type 2 diabetes? Adults  Adults age 41 and older. These adults should be screened at least once every three years.  Adults who are younger than 45, overweight, and have at least one other risk factor. These adults should be screened at least once every three years.  Adults who have normal blood glucose levels and two or more risk factors. These adults may be screened once every year (annually).  Women who have had gestational diabetes in the past. These women should be screened at least once every three years.  Pregnant women who have risk factors. These women should be screened at their first prenatal visit.  Pregnant women with no risk factors. These women should be screened between weeks 24 and 28 of pregnancy. Children and adolescents  Children and adolescents should be screened for type 2 diabetes if they are overweight and have 2 of the following risk factors: ? A family history of type 2 diabetes. ? Being a member of a high risk race or ethnic group. ? Signs of insulin resistance or conditions associated with insulin resistance. ? A mother who had gestational diabetes while pregnant with him or her.  Screening should be done at least once every three years, starting at age 64. Your health care provider or your child's health care provider may recommend having a screening more or less often. What happens during screening? During screening, your health care provider may ask questions about:  Your health and your risk factors, including your activity level and  any medical conditions that you have.  The health of your first-degree relatives.  Past pregnancies, if this applies.  Your health care provider will also do a physical exam, including a blood pressure measurement and blood tests. There are four blood tests that can be used to screen for type 2 diabetes. You may have one or more of the following:  A fasting plasma glucose test (FBG). You will not be allowed to eat for at least eight hours before a blood sample is taken.  A random blood glucose test. This test checks your blood glucose at any time of the day regardless of when you ate.  An oral glucose tolerance test (OGTT). This test measures your blood glucose at two times: ? After you have not eaten (have fasted) overnight. ? Two hours after you drink a glucose-containing beverage. A diagnosis can be made if the level is greater than 200 mg/dL.  An A1c test. This test provides information about blood glucose control over the previous three months.  What do the results mean? Your test results are a measurement of how much glucose is in your blood. Normal blood glucose levels mean that you do not have diabetes or prediabetes. High blood glucose levels may mean that you have prediabetes or diabetes. Depending on the results, other tests may be needed to confirm the diagnosis. This information is not intended to replace advice given to you by your health care provider. Make sure you discuss any questions you have with your health care provider. Document Released: 08/16/2009 Document Revised: 03/27/2016 Document Reviewed: 08/17/2015 Elsevier Interactive Patient Education  2017 [employer]  Dyslipidemia Dyslipidemia is an imbalance of waxy, fat-like substances (lipids) in the blood. The body needs lipids in small amounts. Dyslipidemia often involves a high level of cholesterol or triglycerides, which are types of lipids. Common forms of dyslipidemia include:  High levels of bad cholesterol  (LDL cholesterol). LDL is the type of cholesterol that causes fatty deposits (plaques) to build up in the blood vessels that carry blood away from your heart (arteries).  Low levels of good cholesterol (HDL cholesterol). HDL cholesterol is the type of cholesterol that protects against heart disease. High levels of HDL remove the LDL buildup from arteries.  High levels of triglycerides. Triglycerides are a fatty substance in the blood that is linked to a buildup of plaques in the arteries.  You can develop dyslipidemia because of the genes you are born with (primary dyslipidemia) or changes that occur during your life (secondary dyslipidemia), or as a side effect of certain medical treatments. What are the causes? Primary dyslipidemia is caused by changes (mutations) in genes that are passed down through families (inherited). These mutations cause several types of dyslipidemia. Mutations can result in disorders that make the body produce too much LDL cholesterol or triglycerides, or not enough HDL cholesterol. These disorders may lead to heart disease, arterial disease, or stroke at an early age. Causes of secondary dyslipidemia include certain lifestyle choices and diseases that lead to dyslipidemia, such as:  Eating a diet that is high in animal fat.  Not getting enough activity or exercise (having a sedentary lifestyle).  Having diabetes, kidney disease, liver disease, or thyroid disease.  Drinking large amounts of alcohol.  Using certain types of drugs.  What increases the risk? You may be at greater risk for dyslipidemia if you are an older man or if you are a woman who has gone through menopause. Other risk factors include:  Having a family history of dyslipidemia.  Taking certain medicines, including birth control pills, steroids, some diuretics, beta-blockers, and some medicines forHIV.  Smoking cigarettes.  Eating a high-fat diet.  Drinking large amounts of alcohol.  Having  certain medical conditions such as diabetes, polycystic ovary syndrome (PCOS), pregnancy, kidney disease, liver disease, or hypothyroidism.  Not exercising regularly.  Being overweight or obese with too much belly fat.  What are the signs or symptoms? Dyslipidemia does not usually cause any symptoms. Very high lipid levels can cause fatty bumps under the skin (xanthomas) or a white or gray ring around the black center (pupil) of the eye. Very high triglyceride  levels can cause inflammation of the pancreas (pancreatitis). How is this diagnosed? Your health care provider may diagnose dyslipidemia based on a routine blood test (fasting blood test). Because most people do not have symptoms of the condition, this blood testing (lipid profile) is done on adults age 60 and older and is repeated every 5 years. This test checks:  Total cholesterol. This is a measure of the total amount of cholesterol in your blood, including LDL cholesterol, HDL cholesterol, and triglycerides. A healthy number is below 200.  LDL cholesterol. The target number for LDL cholesterol is different for each person, depending on individual risk factors. For most people, a number below 100 is healthy. Ask your health care provider what your LDL cholesterol number should be.  HDL cholesterol. An HDL level of 60 or higher is best because it helps to protect against heart disease. A number below 40 for men or below 50 for women increases the risk for heart disease.  Triglycerides. A healthy triglyceride number is below 150.  If your lipid profile is abnormal, your health care provider may do other blood tests to get more information about your condition. How is this treated? Treatment depends on the type of dyslipidemia that you have and your other risk factors for heart disease and stroke. Your health care provider will have a target range for your lipid levels based on this information. For many people, treatment starts with  lifestyle changes, such as diet and exercise. Your health care provider may recommend that you:  Get regular exercise.  Make changes to your diet.  Quit smoking if you smoke.  If diet changes and exercise do not help you reach your goals, your health care provider may also prescribe medicine to lower lipids. The most commonly prescribed type of medicine lowers your LDL cholesterol (statin drug). If you have a high triglyceride level, your provider may prescribe another type of drug (fibrate) or an omega-3 fish oil supplement, or both. Follow these instructions at home:  Take over-the-counter and prescription medicines only as told by your health care provider. This includes supplements.  Get regular exercise. Start an aerobic exercise and strength training program as told by your health care provider. Ask your health care provider what activities are safe for you. Your health care provider may recommend: ? 30 minutes of aerobic activity 4-6 days a week. Brisk walking is an example of aerobic activity. ? Strength training 2 days a week.  Eat a healthy diet as told by your health care provider. This can help you reach and maintain a healthy weight, lower your LDL cholesterol, and raise your HDL cholesterol. It may help to work with a diet and nutrition specialist (dietitian) to make a plan that is right for you. Your dietitian or health care provider may recommend: ? Limiting your calories, if you are overweight. ? Eating more fruits, vegetables, whole grains, fish, and lean meats. ? Limiting saturated fat, trans fat, and cholesterol.  Follow instructions from your health care provider or dietitian about eating or drinking restrictions.  Limit alcohol intake to no more than one drink per day for nonpregnant women and two drinks per day for men. One drink equals 12 oz of beer, 5 oz of wine, or 1 oz of hard liquor.  Do not use any products that contain nicotine or tobacco, such as cigarettes  and e-cigarettes. If you need help quitting, ask your health care provider.  Keep all follow-up visits as told by your health care  provider. This is important. Contact a health care provider if:  You are having trouble sticking to your exercise or diet plan.  You are struggling to quit smoking or control your use of alcohol. Summary  Dyslipidemia is an imbalance of waxy, fat-like substances (lipids) in the blood. The body needs lipids in small amounts. Dyslipidemia often involves a high level of cholesterol or triglycerides, which are types of lipids.  Treatment depends on the type of dyslipidemia that you have and your other risk factors for heart disease and stroke.  For many people, treatment starts with lifestyle changes, such as diet and exercise. Your health care provider may also prescribe medicine to lower lipids. This information is not intended to replace advice given to you by your health care provider. Make sure you discuss any questions you have with your health care provider. Document Released: 10/25/2013 Document Revised: 06/16/2016 Document Reviewed: 06/16/2016 Elsevier Interactive Patient Education  [employer]2018 [employer]

## 2017-09-16 NOTE — Progress Notes (Signed)
Assessment & Plan:  Ronald Massey was seen today for establish care.  Diagnoses and all orders for this visit:  Essential hypertension -     carvedilol (COREG) 6.25 MG tablet; TAKE 1 TABLET BY MOUTH 2 TIMES DAILY WITH A MEAL. -     chlorthalidone (HYGROTON) 25 MG tablet; Take 1 tablet (25 mg total) daily by mouth. Controlled Continue all antihypertensives as prescribed. Remember to bring in your blood pressure log with you for your follow up appointment.  DASH DIET  Hyperlipidemia associated with type 2 diabetes mellitus (HCC) -     Glucose (CBG) -     HgB A1c -     atorvastatin (LIPITOR) 20 MG tablet; Take 1 tablet (20 mg total) daily by mouth. Diabetes Mellitus, Type 2 : Controlled Continue medications. Keep blood sugar logs as instructed.   Type 2 diabetes mellitus with complication, without long-term current use of insulin (HCC) -     metFORMIN (GLUCOPHAGE) 500 MG tablet; Take 1 tablet (500 mg total) 2 (two) times daily with a meal by mouth. -     Ambulatory referral to Dentistry -     Microalbumin / creatinine urine ratio  Primary open angle glaucoma of both eyes, moderate stage -     Travoprost, BAK Free, (TRAVATAN Z) 0.004 % SOLN ophthalmic solution; Place 1 drop at bedtime into both eyes.  Immunization due -     Flu Vaccine QUAD 6+ mos PF IM (Fluarix Quad PF)     Subjective:   Chief Complaint  Patient presents with  . Establish Care    Patient is here for diabetes and hypertension check up. Patient stated he need medication refills.   Ronald Massey 61 y.o. male presents to office today to establish care. He has a history of DM, HTN, HPL and glaucoma as well as a right eye cataract.    Hypertension Patient here for follow-up of hypertension. He is not exercising but is somewhat adherent to low salt diet.  He does not have his blood pressure log today.  Blood pressure is well controlled at home.  Cardiac symptoms none. Patient denies chest pain, claudication,  dyspnea, exertional chest pressure/discomfort, irregular heart beat, lower extremity edema, near-syncope, palpitations, paroxysmal nocturnal dyspnea and tachypnea.  Cardiovascular risk factors: advanced age (older than 3355 for men, 6565 for women), diabetes mellitus, dyslipidemia, hypertension and male gender. Use of agents associated with hypertension: none.  History of target organ damage: retinopathy. BP Readings from Last 3 Encounters: 09/16/17 : 126/76 06/02/17 : 124/73 05/20/17 : 123/75   Hyperlipidemia Patient presents for follow up to hyperlipidemia.  He is medication compliant and denies chest pain, exertional chest pressure/discomfort, fatigue, feeding intolerance, lower extremity edema, palpitations and tachypnea or statin intolerance including myalgias.  Lab Results      Component                Value               Date                      CHOL                     172                 04/14/2017                CHOL  174                 05/26/2016                CHOL                     196                 07/17/2010           Lab Results      Component                Value               Date                      HDL                      43                  04/14/2017                HDL                      47                  05/26/2016                HDL                      42                  07/17/2010           Lab Results      Component                Value               Date                      LDLCALC                  111 (H)             04/14/2017                LDLCALC                  93                  05/26/2016                LDLCALC                  130 (H)             07/17/2010           Lab Results      Component                Value               Date                      TRIG                     91  04/14/2017                TRIG                     171 (H)             05/26/2016                TRIG                     121                  07/17/2010           Lab Results      Component                Value               Date                      CHOLHDL                  4.0                 04/14/2017                CHOLHDL                  3.7                 05/26/2016                CHOLHDL                  4.7 Ratio           07/17/2010           No results found for: LDLDIRECT  Diabetes Mellitus Type II Patient here for follow-up of Type 2 diabetes mellitus.  Current symptoms/problems include none.  Known diabetic complications: retinopathy  Eye exam current (within one year): yes Weight trend: stable Prior visit with dietician: no Current monitoring regimen: office lab tests - 4 times yearly Any episodes of hypoglycemia? no Is He on ACE inhibitor or angiotensin II receptor blocker? No; awaiting microalbumin Yes  Lab Results      Component                Value               Date                      HGBA1C                   6.4                 09/16/2017                HGBA1C                   6.6                 04/14/2017                Eye Problem   The right (Glaucoma; both eyes. Cataract; right eye) eye is affected. This is a chronic problem. The current episode started more than 1 month ago. The problem occurs constantly. The problem has been unchanged. There was no  injury mechanism. The pain is mild. There is no known exposure to pink eye. He does not wear contacts. Associated symptoms include blurred vision. Pertinent negatives include no eye discharge, double vision, eye redness, fever, nausea, photophobia or vomiting. He has tried eye drops for the symptoms. The treatment provided moderate relief.     Past Medical History:  Diagnosis Date  . Diabetes mellitus without complication (HCC)   . Hypertension   . Pollen allergies     History reviewed. No pertinent surgical history.  History reviewed. No pertinent family history.  Social History   Socioeconomic History  . Marital  status: Married    Spouse name: Not on file  . Number of children: Not on file  . Years of education: Not on file  . Highest education level: Not on file  Social Needs  . Financial resource strain: Not on file  . Food insecurity - worry: Not on file  . Food insecurity - inability: Not on file  . Transportation needs - medical: Not on file  . Transportation needs - non-medical: Not on file  Occupational History  . Not on file  Tobacco Use  . Smoking status: Never Smoker  . Smokeless tobacco: Never Used  Substance and Sexual Activity  . Alcohol use: No  . Drug use: No  . Sexual activity: Not on file  Other Topics Concern  . Not on file  Social History Narrative  . Not on file    Outpatient Medications Prior to Visit  Medication Sig Dispense Refill  . atorvastatin (LIPITOR) 20 MG tablet Take 1 tablet (20 mg total) by mouth daily. 90 tablet 3  . carvedilol (COREG) 6.25 MG tablet TAKE 1 TABLET BY MOUTH 2 TIMES DAILY WITH A MEAL. 60 tablet 2  . cetirizine (ZYRTEC) 10 MG tablet Take 10 mg by mouth daily.    . chlorthalidone (HYGROTON) 25 MG tablet Take 1 tablet (25 mg total) by mouth daily. 30 tablet 2  . metFORMIN (GLUCOPHAGE) 500 MG tablet Take 1 tablet (500 mg total) by mouth 2 (two) times daily with a meal. 180 tablet 3  . TRAVATAN Z 0.004 % SOLN ophthalmic solution PLACE 1 DROP INTO BOTH EYES AT BEDTIME. 45 mL 2   No facility-administered medications prior to visit.     No Known Allergies  Review of Systems  Constitutional: Negative for fever, malaise/fatigue and weight loss.  HENT: Negative.  Negative for nosebleeds.   Eyes: Positive for blurred vision. Negative for double vision, photophobia, pain, discharge and redness.  Respiratory: Negative.  Negative for cough and shortness of breath.   Cardiovascular: Negative.  Negative for chest pain, palpitations and leg swelling.  Gastrointestinal: Negative.  Negative for abdominal pain, constipation, diarrhea, heartburn, nausea  and vomiting.  Musculoskeletal: Negative.  Negative for myalgias.  Neurological: Negative.  Negative for dizziness, focal weakness, seizures and headaches.  Endo/Heme/Allergies: Negative for environmental allergies.  Psychiatric/Behavioral: Negative.  Negative for suicidal ideas.       Objective:    Physical Exam  Constitutional: He is oriented to person, place, and time. He appears well-developed and well-nourished. He is cooperative.  HENT:  Head: Normocephalic and atraumatic.  Eyes: EOM are normal.  Neck: Normal range of motion.  Cardiovascular: Normal rate, regular rhythm, normal heart sounds and intact distal pulses. Exam reveals no gallop and no friction rub.  No murmur heard. Pulmonary/Chest: Effort normal and breath sounds normal. No tachypnea. No respiratory distress. He has no decreased breath sounds. He has no wheezes.  He has no rhonchi. He has no rales. He exhibits no tenderness.  Abdominal: Soft. Bowel sounds are normal.  Musculoskeletal: Normal range of motion. He exhibits no edema, tenderness or deformity.  Neurological: He is alert and oriented to person, place, and time. Coordination normal.  Skin: Skin is warm and dry.  Psychiatric: He has a normal mood and affect. His behavior is normal. Judgment and thought content normal.  Nursing note and vitals reviewed.   BP 126/76 (BP Location: Left Arm, Patient Position: Sitting, Cuff Size: Normal)   Pulse 93   Temp 98.1 F (36.7 C) (Oral)   Resp 18   Ht 5\' 8"  (1.727 m)   Wt 173 lb 6.4 oz (78.7 kg)   SpO2 97%   BMI 26.37 kg/m  Wt Readings from Last 3 Encounters:  09/16/17 173 lb 6.4 oz (78.7 kg)  06/02/17 176 lb (79.8 kg)  04/14/17 171 lb (77.6 kg)        Patient has been counseled extensively about nutrition and exercise as well as the importance of adherence with medications and regular follow-up. The patient was given clear instructions to go to ER or return to medical center if symptoms don't improve, worsen  or new problems develop. The patient verbalized understanding.   Follow-up: Return in about 3 months (around 12/17/2017) for HTN/HPL/DM.   Claiborne Rigg, FNP-BC [facility] [place], [place] [phone]   09/16/2017, 6:08 PM

## 2017-09-16 NOTE — Progress Notes (Signed)
Flu

## 2017-09-17 LAB — MICROALBUMIN / CREATININE URINE RATIO
CREATININE, UR: 199 mg/dL
MICROALB/CREAT RATIO: 5.7 mg/g{creat} (ref 0.0–30.0)
Microalbumin, Urine: 11.3 ug/mL

## 2017-09-18 ENCOUNTER — Telehealth: Payer: Self-pay

## 2017-09-18 NOTE — Telephone Encounter (Signed)
-----   Message from Zelda W Fleming, NP sent at 09/18/2017  9:55 AM EST ----- Micro albumin is normal. At this time that means there is no diabetic kidney disease 

## 2017-09-18 NOTE — Telephone Encounter (Signed)
Patient informed on urine result.   Patient verified DOB.  

## 2017-09-18 NOTE — Telephone Encounter (Signed)
-----   Message from Claiborne RiggZelda W Fleming, NP sent at 09/18/2017  9:55 AM EST ----- Micro albumin is normal. At this time that means there is no diabetic kidney disease

## 2017-11-06 MED FILL — ?ATORVASTATIN 20 MG TABLET: 20 | 30 days supply | Qty: 30 | Fill #1

## 2017-11-06 MED FILL — ?CHLORTHALIDONE 25 MG TABLE: 25 | 30 days supply | Qty: 30 | Fill #1

## 2017-11-06 MED FILL — $Travatan Z 0.004% Eye Drop: 0.004 | 50 days supply | Qty: 1 | Fill #0

## 2017-11-06 MED FILL — ?CARVEDILOL 6.25 MG TABLET: 6.25 | 30 days supply | Qty: 60 | Fill #1

## 2017-11-06 MED FILL — ?METFORMIN HCL 500MG TABLET: 500 | 30 days supply | Qty: 60 | Fill #0

## 2017-12-08 MED FILL — ?CARVEDILOL 6.25 MG TABLET: 6.25 | 30 days supply | Qty: 60 | Fill #2

## 2017-12-08 MED FILL — ?CHLORTHALIDONE 25 MG TABLE: 25 | 30 days supply | Qty: 30 | Fill #2

## 2017-12-08 MED FILL — ?METFORMIN HCL 500MG TABLET: 500 | 30 days supply | Qty: 60 | Fill #1

## 2017-12-09 ENCOUNTER — Ambulatory Visit: Payer: Self-pay | Admitting: Nurse Practitioner

## 2017-12-10 MED FILL — $Travatan Z 0.004% Eye Drop: 0.004 | 50 days supply | Qty: 5 | Fill #1

## 2017-12-23 ENCOUNTER — Ambulatory Visit: Payer: Self-pay | Attending: Nurse Practitioner | Admitting: Nurse Practitioner

## 2017-12-23 ENCOUNTER — Encounter: Payer: Self-pay | Admitting: Nurse Practitioner

## 2017-12-23 VITALS — BP 146/85 | HR 67 | Temp 97.9°F | Ht 68.0 in | Wt 176.0 lb

## 2017-12-23 DIAGNOSIS — H269 Unspecified cataract: Secondary | ICD-10-CM

## 2017-12-23 DIAGNOSIS — Z1211 Encounter for screening for malignant neoplasm of colon: Secondary | ICD-10-CM

## 2017-12-23 DIAGNOSIS — E118 Type 2 diabetes mellitus with unspecified complications: Secondary | ICD-10-CM

## 2017-12-23 DIAGNOSIS — Z79899 Other long term (current) drug therapy: Secondary | ICD-10-CM | POA: Insufficient documentation

## 2017-12-23 DIAGNOSIS — E1136 Type 2 diabetes mellitus with diabetic cataract: Secondary | ICD-10-CM | POA: Insufficient documentation

## 2017-12-23 DIAGNOSIS — E785 Hyperlipidemia, unspecified: Secondary | ICD-10-CM | POA: Insufficient documentation

## 2017-12-23 DIAGNOSIS — I10 Essential (primary) hypertension: Secondary | ICD-10-CM | POA: Insufficient documentation

## 2017-12-23 DIAGNOSIS — K029 Dental caries, unspecified: Secondary | ICD-10-CM | POA: Insufficient documentation

## 2017-12-23 DIAGNOSIS — E1169 Type 2 diabetes mellitus with other specified complication: Secondary | ICD-10-CM

## 2017-12-23 DIAGNOSIS — H409 Unspecified glaucoma: Secondary | ICD-10-CM | POA: Insufficient documentation

## 2017-12-23 DIAGNOSIS — Z7984 Long term (current) use of oral hypoglycemic drugs: Secondary | ICD-10-CM | POA: Insufficient documentation

## 2017-12-23 LAB — GLUCOSE, POCT (MANUAL RESULT ENTRY): POC Glucose: 125 mg/dl — AB (ref 70–99)

## 2017-12-23 MED ORDER — METFORMIN HCL 500 MG PO TABS: 500.0000 mg | ORAL_TABLET | Freq: Two times a day (BID) | ORAL | 3 refills | Status: DC

## 2017-12-23 MED ORDER — CARVEDILOL 6.25 MG PO TABS: ORAL_TABLET | ORAL | 2 refills | Status: DC

## 2017-12-23 MED ORDER — CHLORTHALIDONE 25 MG PO TABS: 25.0000 mg | ORAL_TABLET | Freq: Every day | ORAL | 2 refills | Status: DC

## 2017-12-23 NOTE — Patient Instructions (Addendum)
Athlete's Foot Athlete's foot (tinea pedis) is a fungal infection of the skin on the feet. It often occurs on the skin that is between or underneath the toes. It can also occur on the soles of the feet. The infection can spread from person to person (is contagious). Follow these instructions at home:  Apply or take over-the-counter and prescription medicines only as told by your doctor.  Keep all follow-up visits as told by your doctor. This is important.  Do not scratch your feet.  Keep your feet dry: ? Wear cotton or wool socks. Change your socks every day or if they become wet. ? Wear shoes that allow air to move around, such as sandals or canvas tennis shoes.  Wash and dry your feet: ? Every day or as told by your doctor. ? After exercising. ? Including the area between your toes.  Wear sandals in wet areas, such as locker rooms and shared showers.  Do not share any of these items: ? Towels. ? Nail clippers. ? Other personal items that touch your feet.  If you have diabetes, keep your blood sugar under control. Contact a doctor if:  You have a fever.  You have swelling, soreness, warmth, or redness in your foot.  You are not getting better with treatment.  Your symptoms get worse.  You have new symptoms. This information is not intended to replace advice given to you by your health care provider. Make sure you discuss any questions you have with your health care provider. Document Released: 04/07/2008 Document Revised: 03/27/2016 Document Reviewed: 04/23/2015 Elsevier Interactive Patient Education  2018 [employer].  Prediabetes Prediabetes is the condition of having a blood sugar (blood glucose) level that is higher than it should be, but not high enough for you to be diagnosed with type 2 diabetes. Having prediabetes puts you at risk for developing type 2 diabetes (type 2 diabetes mellitus). Prediabetes may be called impaired glucose tolerance or impaired fasting  glucose. Prediabetes usually does not cause symptoms. Your health care provider can diagnose this condition with blood tests. You may be tested for prediabetes if you are overweight and if you have at least one other risk factor for prediabetes. Risk factors for prediabetes include:  Having a family member with type 2 diabetes.  Being overweight or obese.  Being older than age 65.  Being of American-Indian, African-American, Hispanic/Latino, or Asian/Pacific Islander descent.  Having an inactive (sedentary) lifestyle.  Having a history of gestational diabetes or polycystic ovarian syndrome (PCOS).  Having low levels of good cholesterol (HDL-C) or high levels of blood fats (triglycerides).  Having high blood pressure.  What is blood glucose and how is blood glucose measured?  Blood glucose refers to the amount of glucose in your bloodstream. Glucose comes from eating foods that contain sugars and starches (carbohydrates) that the body breaks down into glucose. Your blood glucose level may be measured in mg/dL (milligrams per deciliter) or mmol/L (millimoles per liter).Your blood glucose may be checked with one or more of the following blood tests:  A fasting blood glucose (FBG) test. You will not be allowed to eat (you will fast) for at least 8 hours before a blood sample is taken. ? A normal range for FBG is 70-100 mg/dl (1.6-1.0 mmol/L).  An A1c (hemoglobin A1c) blood test. This test provides information about blood glucose control over the previous 2?3months.  An oral glucose tolerance test (OGTT). This test measures your blood glucose twice: ? After fasting. This is  your baseline level. ? Two hours after you drink a beverage that contains glucose.  You may be diagnosed with prediabetes:  If your FBG is 100?125 mg/dL (1.6-1.0 mmol/L).  If your A1c level is 5.7?6.4%.  If your OGGT result is 140?199 mg/dL (9.6-04 mmol/L).  These blood tests may be repeated to confirm your  diagnosis. What happens if blood glucose is too high? The pancreas produces a hormone (insulin) that helps move glucose from the bloodstream into cells. When cells in the body do not respond properly to insulin that the body makes (insulin resistance), excess glucose builds up in the blood instead of going into cells. As a result, high blood glucose (hyperglycemia) can develop, which can cause many complications. This is a symptom of prediabetes. What can happen if blood glucose stays higher than normal for a long time? Having high blood glucose for a long time is dangerous. Too much glucose in your blood can damage your nerves and blood vessels. Long-term damage can lead to complications from diabetes, which may include:  Heart disease.  Stroke.  Blindness.  Kidney disease.  Depression.  Poor circulation in the feet and legs, which could lead to surgical removal (amputation) in severe cases.  How can prediabetes be prevented from turning into type 2 diabetes?  To help prevent type 2 diabetes, take the following actions:  Be physically active. ? Do moderate-intensity physical activity for at least 30 minutes on at least 5 days of the week, or as much as told by your health care provider. This could be brisk walking, biking, or water aerobics. ? Ask your health care provider what activities are safe for you. A mix of physical activities may be best, such as walking, swimming, cycling, and strength training.  Lose weight as told by your health care provider. ? Losing 5-7% of your body weight can reverse insulin resistance. ? Your health care provider can determine how much weight loss is best for you and can help you lose weight safely.  Follow a healthy meal plan. This includes eating lean proteins, complex carbohydrates, fresh fruits and vegetables, low-fat dairy products, and healthy fats. ? Follow instructions from your health care provider about eating or drinking  restrictions. ? Make an appointment to see a diet and nutrition specialist (registered dietitian) to help you create a healthy eating plan that is right for you.  Do not smoke or use any tobacco products, such as cigarettes, chewing tobacco, and e-cigarettes. If you need help quitting, ask your health care provider.  Take over-the-counter and prescription medicines as told by your health care provider. You may be prescribed medicines that help lower the risk of type 2 diabetes.  This information is not intended to replace advice given to you by your health care provider. Make sure you discuss any questions you have with your health care provider. Document Released: 02/11/2016 Document Revised: 03/27/2016 Document Reviewed: 12/11/2015 Elsevier Interactive Patient Education  2018 [employer]. Prediabetes Eating Plan Prediabetes-also called impaired glucose tolerance or impaired fasting glucose-is a condition that causes blood sugar (blood glucose) levels to be higher than normal. Following a healthy diet can help to keep prediabetes under control. It can also help to lower the risk of type 2 diabetes and heart disease, which are increased in people who have prediabetes. Along with regular exercise, a healthy diet:  Promotes weight loss.  Helps to control blood sugar levels.  Helps to improve the way that the body uses insulin.  What do I  need to know about this eating plan?  Use the glycemic index (GI) to plan your meals. The index tells you how quickly a food will raise your blood sugar. Choose low-GI foods. These foods take a longer time to raise blood sugar.  Pay close attention to the amount of carbohydrates in the food that you eat. Carbohydrates increase blood sugar levels.  Keep track of how many calories you take in. Eating the right amount of calories will help you to achieve a healthy weight. Losing about 7 percent of your starting weight can help to prevent type 2 diabetes.  You  may want to follow a Mediterranean diet. This diet includes a lot of vegetables, lean meats or fish, whole grains, fruits, and healthy oils and fats. What foods can I eat? Grains Whole grains, such as whole-wheat or whole-grain breads, crackers, cereals, and pasta. Unsweetened oatmeal. Bulgur. Barley. Quinoa. Brown rice. Corn or whole-wheat flour tortillas or taco shells. Vegetables Lettuce. Spinach. Peas. Beets. Cauliflower. Cabbage. Broccoli. Carrots. Tomatoes. Squash. Eggplant. Herbs. Peppers. Onions. Cucumbers. Brussels sprouts. Fruits Berries. Bananas. Apples. Oranges. Grapes. Papaya. Mango. Pomegranate. Kiwi. Grapefruit. Cherries. Meats and Other Protein Sources Seafood. Lean meats, such as chicken and [place] or lean cuts of pork and beef. Tofu. Eggs. Nuts. Beans. Dairy Low-fat or fat-free dairy products, such as yogurt, cottage cheese, and cheese. Beverages Water. Tea. Coffee. Sugar-free or diet soda. Seltzer water. Milk. Milk alternatives, such as soy or almond milk. Condiments Mustard. Relish. Low-fat, low-sugar ketchup. Low-fat, low-sugar barbecue sauce. Low-fat or fat-free mayonnaise. Sweets and Desserts Sugar-free or low-fat pudding. Sugar-free or low-fat ice cream and other frozen treats. Fats and Oils Avocado. Walnuts. Olive oil. The items listed above may not be a complete list of recommended foods or beverages. Contact your dietitian for more options. What foods are not recommended? Grains Refined white flour and flour products, such as bread, pasta, snack foods, and cereals. Beverages Sweetened drinks, such as sweet iced tea and soda. Sweets and Desserts Baked goods, such as cake, cupcakes, pastries, cookies, and cheesecake. The items listed above may not be a complete list of foods and beverages to avoid. Contact your dietitian for more information. This information is not intended to replace advice given to you by your health care provider. Make sure you discuss any  questions you have with your health care provider. Document Released: 03/06/2015 Document Revised: 03/27/2016 Document Reviewed: 11/15/2014 Elsevier Interactive Patient Education  2017 Elsevier Inc.  Preventing Type 2 Diabetes Mellitus Type 2 diabetes (type 2 diabetes mellitus) is a long-term (chronic) disease that affects blood sugar (glucose) levels. Normally, a hormone called insulin allows glucose to enter cells in the body. The cells use glucose for energy. In type 2 diabetes, one or both of these problems may be present:  The body does not make enough insulin.  The body does not respond properly to insulin that it makes (insulin resistance).  Insulin resistance or lack of insulin causes excess glucose to build up in the blood instead of going into cells. As a result, high blood glucose (hyperglycemia) develops, which can cause many complications. Being overweight or obese and having an inactive (sedentary) lifestyle can increase your risk for diabetes. Type 2 diabetes can be delayed or prevented by making certain nutrition and lifestyle changes. What nutrition changes can be made?  Eat healthy meals and snacks regularly. Keep a healthy snack with you for when you get hungry between meals, such as fruit or a handful of nuts.  Eat lean  meats and proteins that are low in saturated fats, such as chicken, fish, egg whites, and beans. Avoid processed meats.  Eat plenty of fruits and vegetables and plenty of grains that have not been processed (whole grains). It is recommended that you eat: ? 1?2 cups of fruit every day. ? 2?3 cups of vegetables every day. ? 6?8 oz of whole grains every day, such as oats, whole wheat, bulgur, brown rice, quinoa, and millet.  Eat low-fat dairy products, such as milk, yogurt, and cheese.  Eat foods that contain healthy fats, such as nuts, avocado, olive oil, and canola oil.  Drink water throughout the day. Avoid drinks that contain added sugar, such as  soda or sweet tea.  Follow instructions from your health care provider about specific eating or drinking restrictions.  Control how much food you eat at a time (portion size). ? Check food labels to find out the serving sizes of foods. ? Use a kitchen scale to weigh amounts of foods.  Saute or steam food instead of frying it. Cook with water or broth instead of oils or butter.  Limit your intake of: ? Salt (sodium). Have no more than 1 tsp (2,400 mg) of sodium a day. If you have heart disease or high blood pressure, have less than ? tsp (1,500 mg) of sodium a day. ? Saturated fat. This is fat that is solid at room temperature, such as butter or fat on meat. What lifestyle changes can be made?  Activity  Do moderate-intensity physical activity for at least 30 minutes on at least 5 days of the week, or as much as told by your health care provider.  Ask your health care provider what activities are safe for you. A mix of physical activities may be best, such as walking, swimming, cycling, and strength training.  Try to add physical activity into your day. For example: ? Park in spots that are farther away than usual, so that you walk more. For example, park in a far corner of the parking lot when you go to the office or the grocery store. ? Take a walk during your lunch break. ? Use stairs instead of elevators or escalators. Weight Loss  Lose weight as directed. Your health care provider can determine how much weight loss is best for you and can help you lose weight safely.  If you are overweight or obese, you may be instructed to lose at least 5?7 % of your body weight. Alcohol and Tobacco   Limit alcohol intake to no more than 1 drink a day for nonpregnant women and 2 drinks a day for men. One drink equals 12 oz of beer, 5 oz of wine, or 1 oz of hard liquor.  Do not use any tobacco products, such as cigarettes, chewing tobacco, and e-cigarettes. If you need help quitting, ask  your health care provider. Work With Your Health Care Provider  Have your blood glucose tested regularly, as told by your health care provider.  Discuss your risk factors and how you can reduce your risk for diabetes.  Get screening tests as told by your health care provider. You may have screening tests regularly, especially if you have certain risk factors for type 2 diabetes.  Make an appointment with a diet and nutrition specialist (registered dietitian). A registered dietitian can help you make a healthy eating plan and can help you understand portion sizes and food labels. Why are these changes important?  It is possible to prevent or  delay type 2 diabetes and related health problems by making lifestyle and nutrition changes.  It can be difficult to recognize signs of type 2 diabetes. The best way to avoid possible damage to your body is to take actions to prevent the disease before you develop symptoms. What can happen if changes are not made?  Your blood glucose levels may keep increasing. Having high blood glucose for a long time is dangerous. Too much glucose in your blood can damage your blood vessels, heart, kidneys, nerves, and eyes.  You may develop prediabetes or type 2 diabetes. Type 2 diabetes can lead to many chronic health problems and complications, such as: ? Heart disease. ? Stroke. ? Blindness. ? Kidney disease. ? Depression. ? Poor circulation in the feet and legs, which could lead to surgical removal (amputation) in severe cases. Where to find support:  Ask your health care provider to recommend a registered dietitian, diabetes educator, or weight loss program.  Look for local or online weight loss groups.  Join a gym, fitness club, or outdoor activity group, such as a walking club. Where to find more information: To learn more about diabetes and diabetes prevention, visit:  American Diabetes Association (ADA): www.diabetes.[employer]  National Institute of  Diabetes and Digestive and Kidney Diseases: [employer].cawww.niddk.nih.gov/health-information/diabetes  To learn more about healthy eating, visit:  The U.S. Department of Agriculture Architect(USDA), Choose My Plate: http://yates.biz/www.choosemyplate.gov/food-groups  Office of Disease Prevention and Health Promotion (ODPHP), Dietary Guidelines: [employer].siwww.health.gov/dietaryguidelines  Summary  You can reduce your risk for type 2 diabetes by increasing your physical activity, eating healthy foods, and losing weight as directed.  Talk with your health care provider about your risk for type 2 diabetes. Ask about any blood tests or screening tests that you need to have. This information is not intended to replace advice given to you by your health care provider. Make sure you discuss any questions you have with your health care provider. Document Released: 02/11/2016 Document Revised: 03/27/2016 Document Reviewed: 12/11/2015 Elsevier Interactive Patient Education  [employer]2018 Elsevier Inc.

## 2017-12-23 NOTE — Progress Notes (Signed)
Assessment & Plan:  [name] was seen today for follow-up and referral.  Diagnoses and all orders for this visit:  Hyperlipidemia associated with type 2 diabetes mellitus (HCC) -     Glucose (CBG) -     Hemoglobin A1c Work on a low fat, heart healthy diet and participate in regular aerobic exercise program to control as well by working out at least 150 minutes per week. No fried foods. No junk foods, sodas, sugary drinks, unhealthy snacking, or smoking.   Glaucoma of both eyes, unspecified glaucoma type -     Ambulatory referral to Ophthalmology  Cataract of right eye, unspecified cataract type -     Ambulatory referral to Ophthalmology  Essential hypertension -     chlorthalidone (HYGROTON) 25 MG tablet; Take 1 tablet (25 mg total) by mouth daily. -     carvedilol (COREG) 6.25 MG tablet; TAKE 1 TABLET BY MOUTH 2 TIMES DAILY WITH A MEAL. Continue all antihypertensives as prescribed.  Remember to bring in your blood pressure log with you for your follow up appointment.  DASH/Mediterranean Diets are healthier choices for HTN.    Dental cavities -     Ambulatory referral to Dentistry Brush and floss 2 times daily Use Listerine mouth wash to help kill bacteria in the mouth  Type 2 diabetes mellitus with complication, without long-term current use of insulin (HCC) -     metFORMIN (GLUCOPHAGE) 500 MG tablet; Take 1 tablet (500 mg total) by mouth 2 (two) times daily with a meal. Diabetes blood sugar goals  Fasting in AM before breakfast which means at least 8 hrs of no eating or drinking) except water or unsweetened coffee or tea): 90-110 2 hrs after meals: < 160,   Hypoglycemia or low blood sugar: < 70 (You should not have hypoglycemia.)  Aim for 30 minutes of exercise most days. Rethink what you drink. Water is great! Aim for 2-3 Carb Choices per meal (30-45 grams) +/- 1 either way  Aim for 0-15 Carbs per snack if hungry  Include protein in moderation with your meals and  snacks  Consider reading food labels for Total Carbohydrate and Fat Grams of foods  Consider checking BG at alternate times per day  Continue taking medication as directed Be mindful about how much sugar you are adding to beverages and other foods. Fruit Punch - find one with no sugar  Measure and decrease portions of carbohydrate foods  Make your plate and don't go back for seconds    Patient has been counseled on age-appropriate routine health concerns for screening and prevention. These are reviewed and up-to-date. Referrals have been placed accordingly. Immunizations are up-to-date or declined.    Subjective:   Chief Complaint  Patient presents with  . Follow-up    Patient is here for a follow-up.  Marland Kitchen. Referral    Patient would likea referral to the dentist for his tooth pain and ophthalmology.    HPI Ronald LigasFaisal A Massey 62 y.o. male presents to office today for follow up.    Eye Problem   He needs to be referred to the ophthalmologist. He has bilateral glaucoma and a right eye cataract.  He states he was making payment arrangements with his most recent ophthalmologist however he can not afford to continue to pay out of pocket. Vision is currently stable. Associated symptoms include blurred vision. Pertinent negatives include no eye discharge, double vision, eye redness, fever, nausea, photophobia or vomiting. He continues on eye drops prescribed by ophthalmology.  Will place urgent referral.   Hyperlipidemia Taking lipitor as prescribed. Awaiting fasting labs. He denies statin intolerance or myalgias. Lab Results  Component Value Date   LDLCALC 111 (H) 04/14/2017   Diabetes Mellitus Chronic. Stable and controlled on metformin BID. He denies hypo or hyperglycemic symptoms.  He does not have a diabetic glucose meter.  Lab Results  Component Value Date   HGBA1C 7.0 (H) 12/23/2017   Essential Hypertension Blood pressure is slightly elevated today. He endorses medication  compliance taking chlorthalidone 25mg  daily and carvedilol 6.25mg  BID. He does not check his blood pressure at home. Will not make any medication adjustments today. Will continue to monitor at his next office visit. Denies chest pain, shortness of breath, palpitations, lightheadedness, dizziness, headaches or BLE edema.  BP Readings from Last 3 Encounters:  12/23/17 (!) 146/85  09/16/17 126/76  06/02/17 124/73    Review of Systems  Constitutional: Negative for fever, malaise/fatigue and weight loss.  HENT: Negative.  Negative for nosebleeds.   Eyes: Positive for blurred vision. Negative for double vision, photophobia, pain, discharge and redness.  Respiratory: Negative.  Negative for cough and shortness of breath.   Cardiovascular: Negative.  Negative for chest pain, palpitations and leg swelling.  Gastrointestinal: Negative.  Negative for abdominal pain, constipation, diarrhea, heartburn, nausea and vomiting.  Musculoskeletal: Negative.  Negative for myalgias.  Neurological: Negative.  Negative for dizziness, focal weakness, seizures and headaches.  Endo/Heme/Allergies: Negative for environmental allergies.  Psychiatric/Behavioral: Negative.  Negative for suicidal ideas.    Past Medical History:  Diagnosis Date  . Diabetes mellitus without complication (HCC)   . Hypertension   . Pollen allergies     History reviewed. No pertinent surgical history.  History reviewed. No pertinent family history.  Social History Reviewed with no changes to be made today.   Outpatient Medications Prior to Visit  Medication Sig Dispense Refill  . atorvastatin (LIPITOR) 20 MG tablet Take 1 tablet (20 mg total) daily by mouth. 90 tablet 3  . Travoprost, BAK Free, (TRAVATAN Z) 0.004 % SOLN ophthalmic solution Place 1 drop at bedtime into both eyes. 45 mL 2  . carvedilol (COREG) 6.25 MG tablet TAKE 1 TABLET BY MOUTH 2 TIMES DAILY WITH A MEAL. 60 tablet 2  . chlorthalidone (HYGROTON) 25 MG tablet Take 1  tablet (25 mg total) daily by mouth. 30 tablet 2  . metFORMIN (GLUCOPHAGE) 500 MG tablet Take 1 tablet (500 mg total) 2 (two) times daily with a meal by mouth. 180 tablet 3   No facility-administered medications prior to visit.     No Known Allergies     Objective:    BP (!) 146/85 (BP Location: Left Arm, Patient Position: Sitting, Cuff Size: Normal)   Pulse 67   Temp 97.9 F (36.6 C) (Oral)   Ht 5\' 8"  (1.727 m)   Wt 176 lb (79.8 kg)   SpO2 98%   BMI 26.76 kg/m  Wt Readings from Last 3 Encounters:  12/23/17 176 lb (79.8 kg)  09/16/17 173 lb 6.4 oz (78.7 kg)  06/02/17 176 lb (79.8 kg)    Physical Exam  Constitutional: He is oriented to person, place, and time. He appears well-developed and well-nourished. He is cooperative.  HENT:  Head: Normocephalic and atraumatic.  Mouth/Throat: Abnormal dentition. Dental caries present. No dental abscesses.  Eyes: EOM are normal.  Neck: Normal range of motion.  Cardiovascular: Normal rate, regular rhythm, normal heart sounds and intact distal pulses. Exam reveals no gallop and no friction  rub.  No murmur heard. Pulmonary/Chest: Effort normal and breath sounds normal. No tachypnea. No respiratory distress. He has no decreased breath sounds. He has no wheezes. He has no rhonchi. He has no rales. He exhibits no tenderness.  Abdominal: Soft. Bowel sounds are normal.  Musculoskeletal: Normal range of motion. He exhibits no edema.  Neurological: He is alert and oriented to person, place, and time. Coordination normal.  Skin: Skin is warm and dry.  Sensory exam of the foot is normal, tested with the monofilament. Good pulses, no lesions or ulcers, good peripheral pulses.  Psychiatric: He has a normal mood and affect. His behavior is normal. Judgment and thought content normal.  Nursing note and vitals reviewed.     Patient has been counseled extensively about nutrition and exercise as well as the importance of adherence with medications and  regular follow-up. The patient was given clear instructions to go to ER or return to medical center if symptoms don't improve, worsen or new problems develop. The patient verbalized understanding.   Follow-up: Return in about 3 months (around 03/22/2018) for HTN/HPL/DM.   Claiborne Rigg, FNP-BC [facility] [place], [place] [phone]   12/24/2017, 12:38 PM

## 2017-12-24 ENCOUNTER — Other Ambulatory Visit: Payer: Self-pay

## 2017-12-24 ENCOUNTER — Telehealth: Payer: Self-pay

## 2017-12-24 ENCOUNTER — Encounter: Payer: Self-pay | Admitting: Nurse Practitioner

## 2017-12-24 DIAGNOSIS — E785 Hyperlipidemia, unspecified: Principal | ICD-10-CM

## 2017-12-24 DIAGNOSIS — E1169 Type 2 diabetes mellitus with other specified complication: Secondary | ICD-10-CM

## 2017-12-24 LAB — HEMOGLOBIN A1C
Est. average glucose Bld gHb Est-mCnc: 154 mg/dL
Hgb A1c MFr Bld: 7 % — ABNORMAL HIGH (ref 4.8–5.6)

## 2017-12-24 MED ORDER — GLUCOSE BLOOD VI STRP: ORAL_STRIP | 12 refills | Status: DC

## 2017-12-24 MED ORDER — CONTOUR NEXT EZ W/DEVICE KIT: 1.0000 | PACK | Freq: Every morning | 0 refills | Status: DC

## 2017-12-24 MED ORDER — MICROLET LANCETS MISC: 12 refills | Status: DC

## 2017-12-24 MED ORDER — TRUE METRIX METER W/DEVICE KIT: PACK | 0 refills | Status: DC

## 2017-12-24 MED ORDER — BLOOD GLUCOSE-BP MONITOR DEVI
0 refills | Status: DC
Start: 2017-12-24 — End: 2017-12-24

## 2017-12-24 MED ORDER — TRUEPLUS LANCETS 28G MISC: 3 refills | Status: DC

## 2017-12-24 NOTE — Telephone Encounter (Signed)
CMA spoke to patient to inform him on lab results and PCP advising.   Patient understood and aware to pick up Rx.

## 2017-12-24 NOTE — Telephone Encounter (Signed)
-----   Message from Claiborne RiggZelda W Fleming, NP sent at 12/24/2017  1:23 PM EST ----- A1c has increased to 7. I have sent you a glucometer with supplies to the pharmacy. I would like for you to check you blood sugars once daily before you eat in the morning. Goal blood sugar is 90-130. We will recheck in 3 months. Please call patient: Tests show increased cholesterol/lipid levels. Will need to start on statin or cholesterol/lipid lowering medication. Prescription has been sent to the pharmacy. Patient should work on a low fat, heart healthy diet and participate in regular aerobic exercise program to control as well by working out at least 150 minutes per week. No fried foods. No junk foods, sodas, sugary drinks, unhealthy snacking, or smoking.

## 2017-12-24 NOTE — Telephone Encounter (Signed)
[facility].

## 2017-12-28 ENCOUNTER — Encounter: Payer: Self-pay | Admitting: Gastroenterology

## 2017-12-28 ENCOUNTER — Telehealth: Payer: Self-pay | Admitting: Nurse Practitioner

## 2017-12-28 NOTE — Telephone Encounter (Signed)
Patient is uninsured I mailed him a letter on  12/25/17 to contact [facility]   Thank You

## 2017-12-28 NOTE — Telephone Encounter (Signed)
Patient stopped by office to check on the referral for his eyes. Please fu at your earliest convenience.

## 2018-01-19 MED FILL — CHLORTHALIDONE 25 MG TABLET: 25 | 30 days supply | Qty: 30 | Fill #0

## 2018-01-19 MED FILL — CARVEDILOL 6.25 MG TABLET: 6.25 | 30 days supply | Qty: 60 | Fill #0

## 2018-01-19 MED FILL — metFORMIN HCL 500 MG TABS: 500 | 30 days supply | Qty: 60 | Fill #0

## 2018-01-26 MED FILL — $Travatan Z 0.004% Eye Drop: 0.004 | 50 days supply | Qty: 5 | Fill #2

## 2018-01-28 MED FILL — ILEVRO 0.3% OPHTH DROPS: 0.3 | 25 days supply | Qty: 1 | Fill #0

## 2018-02-17 ENCOUNTER — Encounter: Payer: Self-pay | Admitting: Gastroenterology

## 2018-03-01 MED FILL — CHLORTHALIDONE 25 MG TAB: 25 | 30 days supply | Qty: 30 | Fill #1

## 2018-03-01 MED FILL — CARVEDILOL 6.25 MG TABLET: 6.25 | 30 days supply | Qty: 60 | Fill #1

## 2018-03-01 MED FILL — metFORMIN HCL 500 MG TABS: 500 | 30 days supply | Qty: 60 | Fill #1

## 2018-04-06 MED FILL — CARVEDILOL 6.25 MG TABLET: 6.25 | 30 days supply | Qty: 60 | Fill #2

## 2018-04-06 MED FILL — metFORMIN HCL 500 MG TABS: 500 | 30 days supply | Qty: 60 | Fill #2

## 2018-04-06 MED FILL — CHLORTHALIDONE 25 MG TAB: 25 | 30 days supply | Qty: 30 | Fill #2

## 2018-04-12 ENCOUNTER — Ambulatory Visit: Payer: BLUE CROSS/BLUE SHIELD | Attending: Nurse Practitioner | Admitting: Nurse Practitioner

## 2018-04-12 ENCOUNTER — Encounter: Payer: Self-pay | Admitting: Nurse Practitioner

## 2018-04-12 VITALS — BP 126/83 | HR 70 | Temp 98.2°F | Ht 68.0 in | Wt 179.6 lb

## 2018-04-12 DIAGNOSIS — Z79899 Other long term (current) drug therapy: Secondary | ICD-10-CM | POA: Diagnosis not present

## 2018-04-12 DIAGNOSIS — Z1211 Encounter for screening for malignant neoplasm of colon: Secondary | ICD-10-CM

## 2018-04-12 DIAGNOSIS — E785 Hyperlipidemia, unspecified: Secondary | ICD-10-CM | POA: Diagnosis not present

## 2018-04-12 DIAGNOSIS — E1169 Type 2 diabetes mellitus with other specified complication: Secondary | ICD-10-CM | POA: Insufficient documentation

## 2018-04-12 DIAGNOSIS — Z7984 Long term (current) use of oral hypoglycemic drugs: Secondary | ICD-10-CM | POA: Insufficient documentation

## 2018-04-12 DIAGNOSIS — I10 Essential (primary) hypertension: Secondary | ICD-10-CM | POA: Insufficient documentation

## 2018-04-12 LAB — POCT GLYCOSYLATED HEMOGLOBIN (HGB A1C): Hemoglobin A1C: 7 % — AB (ref 4.0–5.6)

## 2018-04-12 LAB — GLUCOSE, POCT (MANUAL RESULT ENTRY): POC Glucose: 109 mg/dl — AB (ref 70–99)

## 2018-04-12 NOTE — Progress Notes (Signed)
Assessment & Plan:  Ronald Massey was seen today for follow-up.  Diagnoses and all orders for this visit:  Hyperlipidemia associated with type 2 diabetes mellitus ([place]) -     Glucose (CBG) -     HgB A1c -     CBC -     CMP14+EGFR -     Lipid panel INSTRUCTIONS: Work on a low fat, heart healthy diet and participate in regular aerobic exercise program by working out at least 150 minutes per week. No fried foods. No junk foods, sodas, sugary drinks, unhealthy snacking, alcohol or smoking.   Continue blood sugar control as discussed in office today, low carbohydrate diet, and regular physical exercise as tolerated, 150 minutes per week (30 min each day, 5 days per week, or 50 min 3 days per week). Keep blood sugar logs with fasting goal of 80-130 mg/dl, post prandial less than 180.  For Hypoglycemia: BS <60 and Hyperglycemia BS >400; contact the clinic ASAP. Annual eye exams and foot exams are recommended. Patient was instructed to bring in his glucometer for his next appt.     Colon cancer screening -     Fecal occult blood, imunochemical([facility])  Essential hypertension Continue all antihypertensives as prescribed.  Remember to bring in your blood pressure log with you for your follow up appointment.  DASH/Mediterranean Diets are healthier choices for HTN.    Patient has been counseled on age-appropriate routine health concerns for screening and prevention. These are reviewed and up-to-date. Referrals have been placed accordingly. Immunizations are up-to-date or declined.    Subjective:   Chief Complaint  Patient presents with  . Follow-up    Pt. is here for wondering why he need a GI.    HPI Ronald Massey 62 y.o. male presents to office today for follow up of HTN, HPL and DM. He had eye surgery in March for cataract removal. He is no longer wearing glasses and reports his vision is much better.   Type 2 Diabetes Mellitus Disease course has been showing no change. There  are no hypoglycemic symptoms. There are no hypoglycemic complications. Symptoms are stable. There are no diabetic complications aside hyperlipidemia. Risk factors for coronary artery disease include family history, dyslipidemia, diabetes mellitus, hypertension, sedentary lifestyle and stress. Current diabetic treatment includes metformin 545m BID. Patient is compliant with treatment all of the time and does not monitor blood glucose at home.  Weight is  stable. Patient follows a generally healthy diet. Meal planning includes avoidance of concentrated sweets. Patient has not seen a dietician. Patient is not compliant with exercise. He does not see a podiatrist. Eye exam is current.  Lab Results  Component Value Date   HGBA1C 7.0 (H) 04/12/2018   Lab Results  Component Value Date   HGBA1C 7.0 (H) 12/23/2017   Hyperlipidemia Patient presents for follow up to hyperlipidemia.  He is medication compliant. He is diet compliant and denies lower extremity edema, poor exercise tolerance and skin xanthelasma or statin intolerance including myalgias.  Lab Results  Component Value Date   CHOL 172 04/14/2017   Lab Results  Component Value Date   HDL 43 04/14/2017   Lab Results  Component Value Date   LDLCALC 111 (H) 04/14/2017   Lab Results  Component Value Date   TRIG 91 04/14/2017   Lab Results  Component Value Date   CHOLHDL 4.0 04/14/2017   Essential Hypertension Chronic. Well controlled. Endorses medication compliance taking coreg 6.261mBID and Chlorthalidone 2575maily. Denies chest  pain, shortness of breath, palpitations, lightheadedness, dizziness, headaches or BLE edema.  BP Readings from Last 3 Encounters:  04/12/18 126/83  12/23/17 (!) 146/85  09/16/17 126/76    Review of Systems  Constitutional: Negative for fever, malaise/fatigue and weight loss.  HENT: Negative.  Negative for nosebleeds.   Eyes: Negative.  Negative for blurred vision, double vision and photophobia.    Respiratory: Negative.  Negative for cough and shortness of breath.   Cardiovascular: Negative.  Negative for chest pain, palpitations and leg swelling.  Gastrointestinal: Negative.  Negative for heartburn, nausea and vomiting.  Musculoskeletal: Negative.  Negative for myalgias.  Neurological: Negative.  Negative for dizziness, focal weakness, seizures and headaches.  Psychiatric/Behavioral: Negative.  Negative for suicidal ideas.    Past Medical History:  Diagnosis Date  . Diabetes mellitus without complication ([place])   . Hyperlipidemia   . Hypertension   . Pollen allergies     No past surgical history on file.  Family History  Problem Relation Age of Onset  . Hypertension Mother     Social History Reviewed with no changes to be made today.   Outpatient Medications Prior to Visit  Medication Sig Dispense Refill  . carvedilol (COREG) 6.25 MG tablet TAKE 1 TABLET BY MOUTH 2 TIMES DAILY WITH A MEAL. 60 tablet 2  . chlorthalidone (HYGROTON) 25 MG tablet Take 1 tablet (25 mg total) by mouth daily. 30 tablet 2  . metFORMIN (GLUCOPHAGE) 500 MG tablet Take 1 tablet (500 mg total) by mouth 2 (two) times daily with a meal. 180 tablet 3  . Travoprost, BAK Free, (TRAVATAN Z) 0.004 % SOLN ophthalmic solution Place 1 drop at bedtime into both eyes. 45 mL 2  . atorvastatin (LIPITOR) 20 MG tablet Take 1 tablet (20 mg total) daily by mouth. (Patient not taking: Reported on 04/12/2018) 90 tablet 3  . Blood Glucose Monitoring Suppl (CONTOUR NEXT EZ) w/Device KIT 1 Device by Does not apply route AC breakfast. (Patient not taking: Reported on 04/12/2018) 1 kit 0  . glucose blood (TRUE METRIX BLOOD GLUCOSE TEST) test strip Use as instructed (Patient not taking: Reported on 04/12/2018) 100 each 12  . MICROLET LANCETS MISC Use as instructed. (Patient not taking: Reported on 04/12/2018) 100 each 12   No facility-administered medications prior to visit.     No Known Allergies     Objective:    BP  126/83 (BP Location: Right Arm, Patient Position: Sitting, Cuff Size: Normal)   Pulse 70   Temp 98.2 F (36.8 C) (Oral)   Ht '5\' 8"'  (1.727 m)   Wt 179 lb 9.6 oz (81.5 kg)   SpO2 96%   BMI 27.31 kg/m  Wt Readings from Last 3 Encounters:  04/12/18 179 lb 9.6 oz (81.5 kg)  12/23/17 176 lb (79.8 kg)  09/16/17 173 lb 6.4 oz (78.7 kg)    Physical Exam  Constitutional: He is oriented to person, place, and time. He appears well-developed and well-nourished. He is cooperative.  HENT:  Head: Normocephalic and atraumatic.  Eyes: EOM are normal.  Neck: Normal range of motion.  Cardiovascular: Normal rate, regular rhythm, normal heart sounds and intact distal pulses. Exam reveals no gallop and no friction rub.  No murmur heard. Pulmonary/Chest: Effort normal and breath sounds normal. No tachypnea. No respiratory distress. He has no decreased breath sounds. He has no wheezes. He has no rhonchi. He has no rales. He exhibits no tenderness.  Abdominal: Soft. Bowel sounds are normal.  Musculoskeletal: Normal range of  motion. He exhibits no edema.  Neurological: He is alert and oriented to person, place, and time. Coordination normal.  Skin: Skin is warm and dry.  Psychiatric: He has a normal mood and affect. His behavior is normal. Judgment and thought content normal.  Nursing note and vitals reviewed.     Patient has been counseled extensively about nutrition and exercise as well as the importance of adherence with medications and regular follow-up. The patient was given clear instructions to go to ER or return to medical center if symptoms don't improve, worsen or new problems develop. The patient verbalized understanding.   Follow-up: Return in about 4 months (around 08/12/2018).   Gildardo Pounds, FNP-BC [facility] [place] [place], [place]   04/12/2018, 3:55 PM

## 2018-04-13 LAB — CMP14+EGFR
ALT: 20 IU/L (ref 0–44)
AST: 17 IU/L (ref 0–40)
Albumin/Globulin Ratio: 1.4 (ref 1.2–2.2)
Albumin: 4 g/dL (ref 3.6–4.8)
Alkaline Phosphatase: 63 IU/L (ref 39–117)
BUN/Creatinine Ratio: 18 (ref 10–24)
BUN: 17 mg/dL (ref 8–27)
Bilirubin Total: 0.3 mg/dL (ref 0.0–1.2)
CALCIUM: 9 mg/dL (ref 8.6–10.2)
CHLORIDE: 99 mmol/L (ref 96–106)
CO2: 26 mmol/L (ref 20–29)
CREATININE: 0.96 mg/dL (ref 0.76–1.27)
GFR calc non Af Amer: 85 mL/min/{1.73_m2} (ref >59)
GFR, EST AFRICAN AMERICAN: 98 mL/min/{1.73_m2} (ref >59)
GLUCOSE: 105 mg/dL — AB (ref 65–99)
Globulin, Total: 2.8 g/dL (ref 1.5–4.5)
Potassium: 3.8 mmol/L (ref 3.5–5.2)
Sodium: 140 mmol/L (ref 134–144)
TOTAL PROTEIN: 6.8 g/dL (ref 6.0–8.5)

## 2018-04-13 LAB — CBC
HEMOGLOBIN: 13.7 g/dL (ref 13.0–17.7)
Hematocrit: 39.7 % (ref 37.5–51.0)
MCH: 30.9 pg (ref 26.6–33.0)
MCHC: 34.5 g/dL (ref 31.5–35.7)
MCV: 90 fL (ref 79–97)
PLATELETS: 185 10*3/uL (ref 150–450)
RBC: 4.43 x10E6/uL (ref 4.14–5.80)
RDW: 14.1 % (ref 12.3–15.4)
WBC: 5.1 10*3/uL (ref 3.4–10.8)

## 2018-04-13 LAB — LIPID PANEL
Chol/HDL Ratio: 4.6 ratio (ref 0.0–5.0)
Cholesterol, Total: 192 mg/dL (ref 100–199)
HDL: 42 mg/dL (ref >39)
LDL CALC: 128 mg/dL — AB (ref 0–99)
Triglycerides: 110 mg/dL (ref 0–149)
VLDL CHOLESTEROL CAL: 22 mg/dL (ref 5–40)

## 2018-04-14 ENCOUNTER — Telehealth: Payer: Self-pay

## 2018-04-14 NOTE — Telephone Encounter (Signed)
-----   Message from Claiborne RiggZelda W Fleming, NP sent at 04/13/2018  1:25 PM EDT ----- Labs are essentially normal. Make sure you are drinking at least 48 oz of water per day. Work on eating a low fat, heart healthy diet and participate in regular aerobic exercise program to control as well. Exercise at least 150 minutes per week. LDL cholesterol is still elevated. Make sure you are taking your cholesterol medication every day as prescribed.  No fried foods. No junk foods, sodas, sugary drinks, unhealthy snacking, alcohol or smoking.

## 2018-04-14 NOTE — Telephone Encounter (Signed)
CMA spoke to patient to inform on lab results and advising.  Patient understood.   

## 2018-05-27 ENCOUNTER — Other Ambulatory Visit: Payer: Self-pay | Admitting: Nurse Practitioner

## 2018-05-27 DIAGNOSIS — I10 Essential (primary) hypertension: Secondary | ICD-10-CM

## 2018-06-07 MED FILL — CARVEDILOL 6.25 MG TABLET: 6.25 | 30 days supply | Qty: 60 | Fill #0

## 2018-06-07 MED FILL — TRAVATAN Z 0.004% EYE DROP: 0.004 | 25 days supply | Qty: 3 | Fill #0

## 2018-06-07 MED FILL — CHLORTHALIDONE 25 MG TAB: 25 | 30 days supply | Qty: 30 | Fill #0

## 2018-08-09 MED FILL — TRAVATAN Z 0.004% EYE DROP: 0.004 | 25 days supply | Qty: 3 | Fill #1

## 2018-08-09 MED FILL — CARVEDILOL 6.25 MG TABLET: 6.25 | 30 days supply | Qty: 60 | Fill #1

## 2018-08-09 MED FILL — CHLORTHALIDONE 25 MG TAB: 25 | 30 days supply | Qty: 30 | Fill #1

## 2018-08-11 MED FILL — metFORMIN HCL 500 MG TABS: 500 | 30 days supply | Qty: 60 | Fill #3

## 2018-08-13 ENCOUNTER — Ambulatory Visit: Payer: BLUE CROSS/BLUE SHIELD | Admitting: Nurse Practitioner

## 2018-09-03 ENCOUNTER — Ambulatory Visit: Payer: BLUE CROSS/BLUE SHIELD | Admitting: Nurse Practitioner

## 2018-09-03 MED FILL — CARVEDILOL 6.25 MG TABLET: 6.25 | 30 days supply | Qty: 60 | Fill #2

## 2018-09-03 MED FILL — CHLORTHALIDONE 25 MG TAB: 25 | 30 days supply | Qty: 30 | Fill #2

## 2018-09-03 MED FILL — TRAVATAN Z 0.004% EYE DROP: 0.004 | 25 days supply | Qty: 3 | Fill #2

## 2018-09-07 MED FILL — metFORMIN HCL 500 MG TABS: 500 | 30 days supply | Qty: 60 | Fill #4

## 2018-09-14 ENCOUNTER — Ambulatory Visit: Payer: BLUE CROSS/BLUE SHIELD | Attending: Nurse Practitioner | Admitting: Nurse Practitioner

## 2018-09-14 ENCOUNTER — Encounter: Payer: Self-pay | Admitting: Nurse Practitioner

## 2018-09-14 VITALS — BP 130/80 | HR 85 | Temp 99.0°F | Ht 68.0 in | Wt 174.0 lb

## 2018-09-14 DIAGNOSIS — E118 Type 2 diabetes mellitus with unspecified complications: Secondary | ICD-10-CM

## 2018-09-14 DIAGNOSIS — E785 Hyperlipidemia, unspecified: Secondary | ICD-10-CM | POA: Diagnosis not present

## 2018-09-14 DIAGNOSIS — E1169 Type 2 diabetes mellitus with other specified complication: Secondary | ICD-10-CM | POA: Diagnosis not present

## 2018-09-14 DIAGNOSIS — M25561 Pain in right knee: Secondary | ICD-10-CM | POA: Diagnosis not present

## 2018-09-14 DIAGNOSIS — I10 Essential (primary) hypertension: Secondary | ICD-10-CM | POA: Diagnosis not present

## 2018-09-14 DIAGNOSIS — G8929 Other chronic pain: Secondary | ICD-10-CM

## 2018-09-14 DIAGNOSIS — Z23 Encounter for immunization: Secondary | ICD-10-CM

## 2018-09-14 DIAGNOSIS — Z8249 Family history of ischemic heart disease and other diseases of the circulatory system: Secondary | ICD-10-CM | POA: Insufficient documentation

## 2018-09-14 DIAGNOSIS — M25562 Pain in left knee: Secondary | ICD-10-CM | POA: Insufficient documentation

## 2018-09-14 DIAGNOSIS — Z79899 Other long term (current) drug therapy: Secondary | ICD-10-CM | POA: Insufficient documentation

## 2018-09-14 LAB — POCT GLYCOSYLATED HEMOGLOBIN (HGB A1C): Hemoglobin A1C: 7.2 % — AB (ref 4.0–5.6)

## 2018-09-14 LAB — GLUCOSE, POCT (MANUAL RESULT ENTRY): POC GLUCOSE: 155 mg/dL — AB (ref 70–99)

## 2018-09-14 MED ORDER — ATORVASTATIN CALCIUM 20 MG PO TABS: 20.0000 mg | ORAL_TABLET | Freq: Every day | ORAL | 3 refills | Status: DC

## 2018-09-14 MED ORDER — MEFLOQUINE HCL 250 MG PO TABS: 250.0000 mg | ORAL_TABLET | ORAL | 0 refills | Status: AC

## 2018-09-14 MED ORDER — CARVEDILOL 6.25 MG PO TABS: 6.2500 mg | ORAL_TABLET | Freq: Two times a day (BID) | ORAL | 0 refills | Status: DC

## 2018-09-14 MED ORDER — IBUPROFEN 800 MG PO TABS: 800.0000 mg | ORAL_TABLET | Freq: Three times a day (TID) | ORAL | 0 refills | Status: AC | PRN

## 2018-09-14 MED ORDER — METFORMIN HCL 500 MG PO TABS: 500.0000 mg | ORAL_TABLET | Freq: Two times a day (BID) | ORAL | 3 refills | Status: DC

## 2018-09-14 MED FILL — MEFLOQUINE HCL 250 MG TAB: 250 | 84 days supply | Qty: 12 | Fill #0

## 2018-09-14 MED FILL — ATORVASTATIN 20 MG TABLET: 20 | 90 days supply | Qty: 90 | Fill #0

## 2018-09-14 MED FILL — IBUPROFEN 800 MG TABLET: 800 | 60 days supply | Qty: 180 | Fill #0

## 2018-09-14 NOTE — Progress Notes (Signed)
Assessment & Plan:  Ronald Massey was seen today for follow-up.  Diagnoses and all orders for this visit:  Essential hypertension -     carvedilol (COREG) 6.25 MG tablet; Take 1 tablet (6.25 mg total) by mouth 2 (two) times daily with a meal. Continue all antihypertensives as prescribed.  Remember to bring in your blood pressure log with you for your follow up appointment.  DASH/Mediterranean Diets are healthier choices for HTN.   Hyperlipidemia associated with type 2 diabetes mellitus (HCC) -     Glucose (CBG) -     HgB A1c -     atorvastatin (LIPITOR) 20 MG tablet; Take 1 tablet (20 mg total) by mouth daily. INSTRUCTIONS: Work on a low fat, heart healthy diet and participate in regular aerobic exercise program by working out at least 150 minutes per week; 5 days a week-30 minutes per day. Avoid red meat, fried foods. junk foods, sodas, sugary drinks, unhealthy snacking, alcohol and smoking.  Drink at least 48oz of water per day and monitor your carbohydrate intake daily.    Type 2 diabetes mellitus with complication, without long-term current use of insulin (HCC) -     metFORMIN (GLUCOPHAGE) 500 MG tablet; Take 1 tablet (500 mg total) by mouth 2 (two) times daily with a meal. Continue blood sugar control as discussed in office today, low carbohydrate diet, and regular physical exercise as tolerated, 150 minutes per week (30 min each day, 5 days per week, or 50 min 3 days per week). Keep blood sugar logs with fasting goal of 90-130 mg/dl, post prandial (after you eat) less than 180.  For Hypoglycemia: BS <60 and Hyperglycemia BS >400; contact the clinic ASAP. Annual eye exams and foot exams are recommended.   Chronic pain of both knees -     ibuprofen (ADVIL,MOTRIN) 800 MG tablet; Take 1 tablet (800 mg total) by mouth every 8 (eight) hours as needed. Work on losing weight to help reduce joint pain. May alternate with heat and ice application for pain relief. May also alternate with  acetaminophen and Ibuprofen as prescribed pain relief. Other alternatives include massage, acupuncture and water aerobics.  You must stay active and avoid a sedentary lifestyle.  Need for immunization against malaria -     mefloquine (LARIAM) 250 MG tablet; Take 1 tablet (250 mg total) by mouth every 7 (seven) days.    Patient has been counseled on age-appropriate routine health concerns for screening and prevention. These are reviewed and up-to-date. Referrals have been placed accordingly. Immunizations are up-to-date or declined.    Subjective:   Chief Complaint  Patient presents with  . Follow-up    Pt. is here to follow-up on diabetes and hypertension.    HPI Ronald Massey 62 y.o. male presents to office today for follow up to DM and HTN. He reports that he will be traveling back home to [place] next month and will need to be prescribed his medications for 90 days. He is also requesting to take lariam prior to his visit home to [place]. It has been prescribed for him in the past prior to his travel to [place].   CHRONIC HYPERTENSION Disease Monitoring  Blood pressure well controlled today. He does not monitor his blood pressure at home.  BP Readings from Last 3 Encounters:  09/14/18 130/80  04/12/18 126/83  12/23/17 (!) 146/85   Chest pain: no   Dyspnea: no   Claudication: no  Medication compliance: yes, taking carvedilol 6.25 mg BID Medication Side  Effects  Lightheadedness: no   Urinary frequency: no   Edema: no   Impotence: no  Preventitive Healthcare:  Exercise: no   Diet Pattern: diet: general  Salt Restriction:  No    DM type 2 A1C up from 7.0 from 7.2. He endorses eating out more and eating "more junk food". He is not monitoring his blood glucose levels as instructed.  Endorses medication compliance taking metformin 500 mg BID. Denies any hypo or hyperglycemic symptoms at this time. Ophthalmology referral placed today.  Lab Results  Component Value Date   HGBA1C 7.2  (A) 09/14/2018    Hyperlipidemia Patient presents for follow up to hyperlipidemia.  He is medication compliant taking atorvastatin 64m daily. He is diet compliant and denies exertional chest pressure/discomfort, fatigue, lower extremity edema and skin xanthelasma or statin intolerance including myalgias. LDL is not at goal. Fasting lipid panel pending.  Lab Results  Component Value Date   LDLCALC 128 (H) 04/12/2018    Arthralgia Patient complains of arthralgias for which has been present for several months. Pain is located in multiple joints particularly the bilateral knees described as aching, dull, sharp and stabbing, and is intermittent .  Associated symptoms include: crepitation and edema.  The patient has tried nothing for pain relief.  Related to injury:   No.   Review of Systems  Constitutional: Negative for fever, malaise/fatigue and weight loss.  HENT: Negative.  Negative for nosebleeds.   Eyes: Negative.  Negative for blurred vision, double vision and photophobia.  Respiratory: Negative.  Negative for cough and shortness of breath.   Cardiovascular: Negative.  Negative for chest pain, palpitations and leg swelling.  Gastrointestinal: Negative.  Negative for heartburn, nausea and vomiting.  Musculoskeletal: Positive for joint pain. Negative for myalgias.  Neurological: Negative.  Negative for dizziness, focal weakness, seizures and headaches.  Endo/Heme/Allergies: Positive for environmental allergies.  Psychiatric/Behavioral: Negative.  Negative for suicidal ideas.    Past Medical History:  Diagnosis Date  . [place]   . Hyperlipidemia   . Hypertension   . Pollen allergies     History reviewed. No pertinent surgical history.  Family History  Problem Relation Age of Onset  . Hypertension Mother     Social History Reviewed with no changes to be made today.   Outpatient Medications Prior to Visit  Medication Sig Dispense Refill    . Travoprost, BAK Free, (TRAVATAN Z) 0.004 % SOLN ophthalmic solution Place 1 drop at bedtime into both eyes. 45 mL 2  . atorvastatin (LIPITOR) 20 MG tablet Take 1 tablet (20 mg total) daily by mouth. 90 tablet 3  . carvedilol (COREG) 6.25 MG tablet TAKE 1 TABLET BY MOUTH 2 TIMES DAILY WITH A MEAL. 60 tablet 2  . metFORMIN (GLUCOPHAGE) 500 MG tablet Take 1 tablet (500 mg total) by mouth 2 (two) times daily with a meal. 180 tablet 3  . Blood Glucose Monitoring Suppl (CONTOUR NEXT EZ) w/Device KIT 1 Device by Does not apply route AC breakfast. (Patient not taking: Reported on 04/12/2018) 1 kit 0  . glucose blood (TRUE METRIX BLOOD GLUCOSE TEST) test strip Use as instructed (Patient not taking: Reported on 04/12/2018) 100 each 12  . MICROLET LANCETS MISC Use as instructed. (Patient not taking: Reported on 04/12/2018) 100 each 12  . chlorthalidone (HYGROTON) 25 MG tablet TAKE 1 TABLET (25 MG TOTAL) BY MOUTH DAILY. (Patient not taking: Reported on 09/14/2018) 30 tablet 2   No facility-administered medications prior to visit.  No Known Allergies     Objective:    BP 130/80 (BP Location: Left Arm, Patient Position: Sitting, Cuff Size: Normal)   Pulse 85   Temp 99 F (37.2 C) (Oral)   Ht 5' 8" (1.727 m)   Wt 174 lb (78.9 kg)   SpO2 96%   BMI 26.46 kg/m  Wt Readings from Last 3 Encounters:  09/14/18 174 lb (78.9 kg)  04/12/18 179 lb 9.6 oz (81.5 kg)  12/23/17 176 lb (79.8 kg)    Physical Exam  Constitutional: He is oriented to person, place, and time. He appears well-developed and well-nourished. He is cooperative.  HENT:  Head: Normocephalic and atraumatic.  Eyes: EOM are normal.  Neck: Normal range of motion.  Cardiovascular: Normal rate, regular rhythm, normal heart sounds and intact distal pulses. Exam reveals no gallop and no friction rub.  No murmur heard. Pulmonary/Chest: Effort normal and breath sounds normal. No tachypnea. No respiratory distress. He has no decreased breath  sounds. He has no wheezes. He has no rhonchi. He has no rales. He exhibits no tenderness.  Abdominal: Soft. Bowel sounds are normal.  Musculoskeletal: Normal range of motion. He exhibits no edema.       Right knee: Tenderness found. Medial joint line and lateral joint line tenderness noted.       Left knee: Tenderness found. Medial joint line and lateral joint line tenderness noted.  Neurological: He is alert and oriented to person, place, and time. Coordination normal.  Skin: Skin is warm and dry.  Psychiatric: He has a normal mood and affect. His behavior is normal. Judgment and thought content normal.  Nursing note and vitals reviewed.      Patient has been counseled extensively about nutrition and exercise as well as the importance of adherence with medications and regular follow-up. The patient was given clear instructions to go to ER or return to medical center if symptoms don't improve, worsen or new problems develop. The patient verbalized understanding.   Follow-up: Return in about 4 months (around 01/13/2019).   Gildardo Pounds, FNP-BC [facility] and [facility], [place]   09/15/2018, 5:28 PM

## 2018-09-14 NOTE — Patient Instructions (Signed)
GOLD BOND POWDER

## 2018-10-06 ENCOUNTER — Other Ambulatory Visit: Payer: Self-pay

## 2018-10-06 DIAGNOSIS — I10 Essential (primary) hypertension: Secondary | ICD-10-CM

## 2018-10-06 MED ORDER — CHLORTHALIDONE 25 MG PO TABS: 25.0000 mg | ORAL_TABLET | Freq: Every day | ORAL | 0 refills | Status: DC

## 2018-10-06 MED FILL — TRAVATAN Z 0.004% EYE DROP: 0.004 | 75 days supply | Qty: 8 | Fill #3

## 2018-10-06 MED FILL — CARVEDILOL 6.25 MG TABLET: 6.25 | 90 days supply | Qty: 180 | Fill #0

## 2018-10-06 MED FILL — metFORMIN HCL 500 MG TABS: 500 | 90 days supply | Qty: 180 | Fill #0

## 2018-10-06 MED FILL — CHLORTHALIDONE 25 MG TAB: 25 | 90 days supply | Qty: 90 | Fill #0

## 2018-10-06 NOTE — Telephone Encounter (Signed)
Reordered per Zelda, it looks like this was marked as patient not taking but he is in fact taking it [facility].

## 2019-01-12 ENCOUNTER — Ambulatory Visit: Payer: BLUE CROSS/BLUE SHIELD | Admitting: Nurse Practitioner

## 2019-01-28 ENCOUNTER — Ambulatory Visit: Payer: BLUE CROSS/BLUE SHIELD | Admitting: Nurse Practitioner

## 2019-06-27 ENCOUNTER — Ambulatory Visit: Payer: BLUE CROSS/BLUE SHIELD

## 2019-07-07 ENCOUNTER — Ambulatory Visit: Payer: Self-pay

## 2019-08-18 ENCOUNTER — Other Ambulatory Visit: Payer: Self-pay | Admitting: Nurse Practitioner

## 2019-08-18 DIAGNOSIS — I10 Essential (primary) hypertension: Secondary | ICD-10-CM

## 2019-08-18 MED FILL — ATORVASTATIN 20 MG TABLET: 20 | 30 days supply | Qty: 30 | Fill #1

## 2019-08-18 MED FILL — metFORMIN HCL 500 MG TABS: 500 | 30 days supply | Qty: 60 | Fill #1

## 2019-08-31 ENCOUNTER — Telehealth: Payer: Self-pay | Admitting: Nurse Practitioner

## 2019-08-31 DIAGNOSIS — H401132 Primary open-angle glaucoma, bilateral, moderate stage: Secondary | ICD-10-CM

## 2019-08-31 MED ORDER — TRAVOPROST (BAK FREE) 0.004 % OP SOLN: 1.0000 [drp] | Freq: Every day | OPHTHALMIC | 0 refills | Status: DC

## 2019-08-31 MED FILL — TRAVATAN Z 0.004% EYE DROP: 0.004 | 18 days supply | Qty: 3 | Fill #0

## 2019-08-31 NOTE — Telephone Encounter (Signed)
1) Medication(s) Requested (by name): Travoprost, BAK Free, (TRAVATAN Z) 0.004 % SOLN ophthalmic solution [[id number]]   2) Pharmacy of Choice: [facility]   3) Special Requests:   Approved medications will be sent to the pharmacy, we will reach out if there is an issue.  Requests made after 3pm may not be addressed until the following business day!  If a patient is unsure of the name of the medication(s) please note and ask patient to call back when they are able to provide all info, do not send to responsible party until all information is available!

## 2019-08-31 NOTE — Telephone Encounter (Signed)
Will provide refills to last until his appointment next month.

## 2019-09-07 ENCOUNTER — Other Ambulatory Visit: Payer: Self-pay

## 2019-09-07 ENCOUNTER — Encounter: Payer: Self-pay | Admitting: Family Medicine

## 2019-09-07 ENCOUNTER — Ambulatory Visit: Payer: Medicaid Other | Attending: Family Medicine | Admitting: Family Medicine

## 2019-09-07 VITALS — BP 153/82 | HR 70 | Temp 98.0°F | Ht 68.0 in | Wt 164.0 lb

## 2019-09-07 DIAGNOSIS — H401132 Primary open-angle glaucoma, bilateral, moderate stage: Secondary | ICD-10-CM

## 2019-09-07 DIAGNOSIS — Z7984 Long term (current) use of oral hypoglycemic drugs: Secondary | ICD-10-CM | POA: Diagnosis not present

## 2019-09-07 DIAGNOSIS — Z79899 Other long term (current) drug therapy: Secondary | ICD-10-CM | POA: Insufficient documentation

## 2019-09-07 DIAGNOSIS — I10 Essential (primary) hypertension: Secondary | ICD-10-CM | POA: Diagnosis not present

## 2019-09-07 DIAGNOSIS — E785 Hyperlipidemia, unspecified: Secondary | ICD-10-CM | POA: Diagnosis not present

## 2019-09-07 DIAGNOSIS — Z9119 Patient's noncompliance with other medical treatment and regimen: Secondary | ICD-10-CM | POA: Insufficient documentation

## 2019-09-07 DIAGNOSIS — E1169 Type 2 diabetes mellitus with other specified complication: Secondary | ICD-10-CM | POA: Diagnosis not present

## 2019-09-07 LAB — GLUCOSE, POCT (MANUAL RESULT ENTRY): POC Glucose: 89 mg/dl (ref 70–99)

## 2019-09-07 LAB — POCT GLYCOSYLATED HEMOGLOBIN (HGB A1C): HbA1c, POC (controlled diabetic range): 6.5 % (ref 0.0–7.0)

## 2019-09-07 MED ORDER — ATORVASTATIN CALCIUM 20 MG PO TABS: 20.0000 mg | ORAL_TABLET | Freq: Every day | ORAL | 1 refills | Status: DC

## 2019-09-07 MED ORDER — CARVEDILOL 6.25 MG PO TABS: 6.2500 mg | ORAL_TABLET | Freq: Two times a day (BID) | ORAL | 1 refills | Status: DC

## 2019-09-07 MED ORDER — CHLORTHALIDONE 25 MG PO TABS: 25.0000 mg | ORAL_TABLET | Freq: Every day | ORAL | 1 refills | Status: DC

## 2019-09-07 MED ORDER — METFORMIN HCL 500 MG PO TABS: 500.0000 mg | ORAL_TABLET | Freq: Two times a day (BID) | ORAL | 1 refills | Status: DC

## 2019-09-08 ENCOUNTER — Telehealth: Payer: Self-pay

## 2019-09-08 ENCOUNTER — Encounter: Payer: Self-pay | Admitting: Family Medicine

## 2019-09-08 LAB — MICROALBUMIN / CREATININE URINE RATIO
Creatinine, Urine: 163.7 mg/dL
Microalb/Creat Ratio: 3 mg/g creat (ref 0–29)
Microalbumin, Urine: 5.7 ug/mL

## 2019-09-08 LAB — BASIC METABOLIC PANEL
BUN/Creatinine Ratio: 15 (ref 10–24)
BUN: 15 mg/dL (ref 8–27)
CO2: 24 mmol/L (ref 20–29)
Calcium: 9.2 mg/dL (ref 8.6–10.2)
Chloride: 101 mmol/L (ref 96–106)
Creatinine, Ser: 0.97 mg/dL (ref 0.76–1.27)
GFR calc Af Amer: 96 mL/min/{1.73_m2} (ref >59)
GFR calc non Af Amer: 83 mL/min/{1.73_m2} (ref >59)
Glucose: 90 mg/dL (ref 65–99)
Potassium: 4.2 mmol/L (ref 3.5–5.2)
Sodium: 140 mmol/L (ref 134–144)

## 2019-09-08 MED FILL — CHLORTHALIDONE 25 MG TAB: 25 | 90 days supply | Qty: 90 | Fill #0

## 2019-09-08 MED FILL — CARVEDILOL 6.25 MG TABLET: 6.25 | 90 days supply | Qty: 180 | Fill #0

## 2019-09-08 NOTE — Telephone Encounter (Signed)
Patient name and DOB has been verified Patient was informed of lab results. Patient had no questions.  

## 2019-09-08 NOTE — Telephone Encounter (Signed)
-----   Message from Charlott Rakes, MD sent at 09/08/2019  2:59 PM EST ----- Please inform the patient that labs are normal. Thank you.

## 2019-09-08 NOTE — Progress Notes (Signed)
Subjective:  Patient ID: Ronald Massey, male    DOB: 1956/01/07  Age: 63 y.o. MRN: [id number]  CC: Diabetes   HPI Ronald Massey is a 63 year old male with a history of type 2 diabetes mellitus (A1c 6.5), hypertension, hyperlipidemia patient of Geryl Rankins who presents today for follow-up visit. Last seen by PCP 1 year ago and states he had traveled to his home country of [place] and was stuck there for 7 months due to the ongoing pandemic and returned within the last 2 months. During that timeframe he endorses compliance with all his medications but of recent ran out of chlorthalidone for his blood pressure hence his elevated blood pressure. With regards to his diabetes he denies hypoglycemia, numbness in extremities. He does have glaucoma and was informed by the pharmacy his eyedrops would not be covered and has been advised to get in touch with ophthalmologist for substitution. Compliant with his statin with no complaints of myalgia.  Past Medical History:  Diagnosis Date  . Diabetes mellitus without complication ([place])   . Hyperlipidemia   . Hypertension   . Pollen allergies     No past surgical history on file.  Family History  Problem Relation Age of Onset  . Hypertension Mother     No Known Allergies  Outpatient Medications Prior to Visit  Medication Sig Dispense Refill  . atorvastatin (LIPITOR) 20 MG tablet Take 1 tablet (20 mg total) by mouth daily. 90 tablet 3  . metFORMIN (GLUCOPHAGE) 500 MG tablet Take 1 tablet (500 mg total) by mouth 2 (two) times daily with a meal. 180 tablet 3  . Blood Glucose Monitoring Suppl (CONTOUR NEXT EZ) w/Device KIT 1 Device by Does not apply route AC breakfast. (Patient not taking: Reported on 04/12/2018) 1 kit 0  . glucose blood (TRUE METRIX BLOOD GLUCOSE TEST) test strip Use as instructed (Patient not taking: Reported on 04/12/2018) 100 each 12  . MICROLET LANCETS MISC Use as instructed. (Patient not taking: Reported on 04/12/2018) 100  each 12  . Travoprost, BAK Free, (TRAVATAN Z) 0.004 % SOLN ophthalmic solution Place 1 drop into both eyes at bedtime. (Patient not taking: Reported on 09/07/2019) 2.5 mL 0  . carvedilol (COREG) 6.25 MG tablet Take 1 tablet (6.25 mg total) by mouth 2 (two) times daily with a meal. 180 tablet 0  . chlorthalidone (HYGROTON) 25 MG tablet Take 1 tablet (25 mg total) by mouth daily. (Patient not taking: Reported on 09/07/2019) 90 tablet 0   No facility-administered medications prior to visit.      ROS Review of Systems  Constitutional: Negative for activity change and appetite change.  HENT: Negative for sinus pressure and sore throat.   Eyes: Negative for visual disturbance.  Respiratory: Negative for cough, chest tightness and shortness of breath.   Cardiovascular: Negative for chest pain and leg swelling.  Gastrointestinal: Negative for abdominal distention, abdominal pain, constipation and diarrhea.  Endocrine: Negative.   Genitourinary: Negative for dysuria.  Musculoskeletal: Negative for joint swelling and myalgias.  Skin: Negative for rash.  Allergic/Immunologic: Negative.   Neurological: Negative for weakness, light-headedness and numbness.  Psychiatric/Behavioral: Negative for dysphoric mood and suicidal ideas.    Objective:  BP (!) 153/82   Pulse 70   Temp 98 F (36.7 C) (Oral)   Ht '5\' 8"'  (1.727 m)   Wt 164 lb (74.4 kg)   SpO2 97%   BMI 24.94 kg/m   BP/Weight 09/07/2019 09/14/2018 12/25/9796  Systolic BP 921 194 174  Diastolic BP 82 80 83  Wt. (Lbs) 164 174 179.6  BMI 24.94 26.46 27.31      Physical Exam Constitutional:      Appearance: He is well-developed.  Neck:     Vascular: No JVD.  Cardiovascular:     Rate and Rhythm: Normal rate.     Heart sounds: Normal heart sounds. No murmur.  Pulmonary:     Effort: Pulmonary effort is normal.     Breath sounds: Normal breath sounds. No wheezing or rales.  Chest:     Chest wall: No tenderness.  Abdominal:      General: Bowel sounds are normal. There is no distension.     Palpations: Abdomen is soft. There is no mass.     Tenderness: There is no abdominal tenderness.  Musculoskeletal: Normal range of motion.     Right lower leg: No edema.     Left lower leg: No edema.  Neurological:     Mental Status: He is alert and oriented to person, place, and time.  Psychiatric:        Mood and Affect: Mood normal.     CMP Latest Ref Rng & Units 09/07/2019 04/12/2018 06/02/2017  Glucose 65 - 99 mg/dL 90 105(H) 167(H)  BUN 8 - 27 mg/dL '15 17 12  ' Creatinine 0.76 - 1.27 mg/dL 0.97 0.96 0.96  Sodium 134 - 144 mmol/L 140 140 141  Potassium 3.5 - 5.2 mmol/L 4.2 3.8 4.3  Chloride 96 - 106 mmol/L 101 99 102  CO2 20 - 29 mmol/L '24 26 23  ' Calcium 8.6 - 10.2 mg/dL 9.2 9.0 9.0  Total Protein 6.0 - 8.5 g/dL - 6.8 -  Total Bilirubin 0.0 - 1.2 mg/dL - 0.3 -  Alkaline Phos 39 - 117 IU/L - 63 -  AST 0 - 40 IU/L - 17 -  ALT 0 - 44 IU/L - 20 -    Lipid Panel     Component Value Date/Time   CHOL 192 04/12/2018 1607   TRIG 110 04/12/2018 1607   HDL 42 04/12/2018 1607   CHOLHDL 4.6 04/12/2018 1607   CHOLHDL 3.7 05/26/2016 1617   VLDL 34 (H) 05/26/2016 1617   LDLCALC 128 (H) 04/12/2018 1607    CBC    Component Value Date/Time   WBC 5.1 04/12/2018 1607   WBC 4.2 05/26/2016 1617   RBC 4.43 04/12/2018 1607   RBC 4.62 05/26/2016 1617   HGB 13.7 04/12/2018 1607   HCT 39.7 04/12/2018 1607   PLT 185 04/12/2018 1607   MCV 90 04/12/2018 1607   MCH 30.9 04/12/2018 1607   MCH 30.5 05/26/2016 1617   MCHC 34.5 04/12/2018 1607   MCHC 34.1 05/26/2016 1617   RDW 14.1 04/12/2018 1607   LYMPHSABS 2.3 07/17/2010 2035   MONOABS 0.4 07/17/2010 2035   EOSABS 0.1 07/17/2010 2035   BASOSABS 0.0 07/17/2010 2035    Lab Results  Component Value Date   HGBA1C 6.5 09/07/2019    Assessment & Plan:   1. Hyperlipidemia associated with type 2 diabetes mellitus (HCC) LDL is 128 which is above goal of less than one-point  Lipid panel due next visit but would benefit from increasing Lipitor to 40 mg Low-cholesterol diet - Glucose (CBG) - HgB Y5W - Basic Metabolic Panel - Microalbumin/Creatinine Ratio, Urine - atorvastatin (LIPITOR) 20 MG tablet; Take 1 tablet (20 mg total) by mouth daily.  Dispense: 90 tablet; Refill: 1 - metFORMIN (GLUCOPHAGE) 500 MG tablet; Take 1 tablet (500 mg total) by  mouth 2 (two) times daily with a meal.  Dispense: 180 tablet; Refill: 1  2. Essential hypertension Uncontrolled due to noncompliance ago Refilled Counseled on blood pressure goal of less than 130/80, low-sodium, DASH diet, medication compliance, 150 minutes of moderate intensity exercise per week. Discussed medication compliance, adverse effects. - carvedilol (COREG) 6.25 MG tablet; Take 1 tablet (6.25 mg total) by mouth 2 (two) times daily with a meal.  Dispense: 180 tablet; Refill: 1 - chlorthalidone (HYGROTON) 25 MG tablet; Take 1 tablet (25 mg total) by mouth daily.  Dispense: 90 tablet; Refill: 1  3.  Type 2 diabetes mellitus with other specified complications Controlled with A1c of 6.5 Continue current regimen Counseled on Diabetic diet, my plate method, 295 minutes of moderate intensity exercise/week Blood sugar logs with fasting goals of 80-120 mg/dl, random of less than 180 and in the event of sugars less than 60 mg/dl or greater than 400 mg/dl encouraged to notify the clinic. Advised on the need for annual eye exams, annual foot exams, Pneumonia vaccine.  4.  Primary open open-angle glaucoma Advised to reach out to his ophthalmologist for substitution of his eyedrops given his current one is not covered for Medicaid.  Meds ordered this encounter  Medications  . atorvastatin (LIPITOR) 20 MG tablet    Sig: Take 1 tablet (20 mg total) by mouth daily.    Dispense:  90 tablet    Refill:  1    Please fill as a 90 day supply  . carvedilol (COREG) 6.25 MG tablet    Sig: Take 1 tablet (6.25 mg total) by mouth 2  (two) times daily with a meal.    Dispense:  180 tablet    Refill:  1    Please fill as a 90 day supply  . chlorthalidone (HYGROTON) 25 MG tablet    Sig: Take 1 tablet (25 mg total) by mouth daily.    Dispense:  90 tablet    Refill:  1  . metFORMIN (GLUCOPHAGE) 500 MG tablet    Sig: Take 1 tablet (500 mg total) by mouth 2 (two) times daily with a meal.    Dispense:  180 tablet    Refill:  1    Please fill as a 90 day supply    Follow-up: Return in about 3 months (around 12/08/2019) for Chronic medical conditions with PCP.       Charlott Rakes, MD, FAAFP. [facility] and [facility], [place]   09/08/2019, 9:32 AM

## 2019-09-09 ENCOUNTER — Other Ambulatory Visit: Payer: Self-pay | Admitting: Pharmacist

## 2019-09-09 DIAGNOSIS — H401132 Primary open-angle glaucoma, bilateral, moderate stage: Secondary | ICD-10-CM

## 2019-09-09 MED ORDER — TRAVOPROST (BAK FREE) 0.004 % OP SOLN: 1.0000 [drp] | Freq: Every day | OPHTHALMIC | 0 refills | Status: DC

## 2019-09-09 MED FILL — TRAVATAN Z 0.004% EYE DROP: 0.004 | 18 days supply | Qty: 3 | Fill #0

## 2019-10-18 MED FILL — TRAVATAN Z 0.004% EYE DROP: 0.004 | 18 days supply | Qty: 3 | Fill #0

## 2019-11-23 MED FILL — metFORMIN HCL 500 MG TABS: 500 | 30 days supply | Qty: 60 | Fill #0

## 2019-12-08 ENCOUNTER — Other Ambulatory Visit: Payer: Self-pay | Admitting: Family Medicine

## 2019-12-08 DIAGNOSIS — H401132 Primary open-angle glaucoma, bilateral, moderate stage: Secondary | ICD-10-CM

## 2019-12-09 MED FILL — TRAVATAN Z 0.004% EYE DROP: 0.004 | 18 days supply | Qty: 3 | Fill #0

## 2019-12-28 MED FILL — TRAVATAN Z 0.004% EYE DROP: 0.004 | 18 days supply | Qty: 3 | Fill #0

## 2019-12-28 MED FILL — metFORMIN HCL 500 MG TABS: 500 | 30 days supply | Qty: 60 | Fill #1

## 2020-01-17 ENCOUNTER — Other Ambulatory Visit: Payer: Self-pay | Admitting: Family Medicine

## 2020-01-17 DIAGNOSIS — H401132 Primary open-angle glaucoma, bilateral, moderate stage: Secondary | ICD-10-CM

## 2020-01-17 MED FILL — TRAVATAN Z 0.004 % SOLN: 0.004 | 18 days supply | Qty: 3 | Fill #0

## 2020-02-07 MED FILL — TRAVATAN Z 0.004 % SOLN: 0.004 | 18 days supply | Qty: 3 | Fill #0

## 2020-03-13 ENCOUNTER — Other Ambulatory Visit: Payer: Self-pay | Admitting: Nurse Practitioner

## 2020-03-13 ENCOUNTER — Other Ambulatory Visit: Payer: Self-pay | Admitting: Family Medicine

## 2020-03-13 DIAGNOSIS — H401132 Primary open-angle glaucoma, bilateral, moderate stage: Secondary | ICD-10-CM

## 2020-04-04 ENCOUNTER — Ambulatory Visit: Payer: Medicaid Other | Admitting: Nurse Practitioner

## 2020-04-04 MED FILL — TRAVATAN Z 0.004 % SOLN: 0.004 | 18 days supply | Qty: 3 | Fill #1

## 2020-05-23 ENCOUNTER — Other Ambulatory Visit: Payer: Self-pay | Admitting: Nurse Practitioner

## 2020-05-23 DIAGNOSIS — H401132 Primary open-angle glaucoma, bilateral, moderate stage: Secondary | ICD-10-CM

## 2020-05-23 MED FILL — TRAVATAN Z 0.004 % SOLN: 0.004 | 23 days supply | Qty: 3 | Fill #0

## 2020-06-21 ENCOUNTER — Other Ambulatory Visit: Payer: Self-pay | Admitting: Family Medicine

## 2020-06-21 DIAGNOSIS — I10 Essential (primary) hypertension: Secondary | ICD-10-CM

## 2020-06-21 MED FILL — CHLORTHALIDONE 25 MG TAB: 25 | 90 days supply | Qty: 90 | Fill #0

## 2020-06-21 MED FILL — TRAVATAN Z 0.004 % SOLN: 0.004 | 23 days supply | Qty: 3 | Fill #1

## 2020-06-21 MED FILL — CARVEDILOL 6.25 MG TABLET: 6.25 | 90 days supply | Qty: 180 | Fill #0

## 2020-06-21 MED FILL — METFORMIN HCL 500 MG TABS: 500 | 30 days supply | Qty: 60 | Fill #3

## 2020-07-31 LAB — HM DIABETES EYE EXAM

## 2020-08-01 ENCOUNTER — Encounter: Payer: Self-pay | Admitting: Nurse Practitioner

## 2020-08-01 ENCOUNTER — Other Ambulatory Visit: Payer: Self-pay | Admitting: Nurse Practitioner

## 2020-08-01 ENCOUNTER — Other Ambulatory Visit: Payer: Self-pay

## 2020-08-01 ENCOUNTER — Other Ambulatory Visit: Payer: Self-pay | Admitting: Family Medicine

## 2020-08-01 ENCOUNTER — Ambulatory Visit: Payer: 59 | Attending: Nurse Practitioner | Admitting: Nurse Practitioner

## 2020-08-01 VITALS — BP 146/79 | HR 90 | Resp 16 | Wt 172.8 lb

## 2020-08-01 DIAGNOSIS — E1169 Type 2 diabetes mellitus with other specified complication: Secondary | ICD-10-CM | POA: Diagnosis not present

## 2020-08-01 DIAGNOSIS — I10 Essential (primary) hypertension: Secondary | ICD-10-CM

## 2020-08-01 DIAGNOSIS — Z130 Encounter for screening for diseases of the blood and blood-forming organs and certain disorders involving the immune mechanism: Secondary | ICD-10-CM

## 2020-08-01 DIAGNOSIS — H401132 Primary open-angle glaucoma, bilateral, moderate stage: Secondary | ICD-10-CM

## 2020-08-01 DIAGNOSIS — E118 Type 2 diabetes mellitus with unspecified complications: Secondary | ICD-10-CM

## 2020-08-01 DIAGNOSIS — E785 Hyperlipidemia, unspecified: Secondary | ICD-10-CM

## 2020-08-01 DIAGNOSIS — Z1211 Encounter for screening for malignant neoplasm of colon: Secondary | ICD-10-CM

## 2020-08-01 LAB — POCT GLYCOSYLATED HEMOGLOBIN (HGB A1C): HbA1c, POC (controlled diabetic range): 7 % (ref 0.0–7.0)

## 2020-08-01 LAB — GLUCOSE, POCT (MANUAL RESULT ENTRY): POC Glucose: 208 mg/dl — AB (ref 70–99)

## 2020-08-01 MED ORDER — TRAVOPROST (BAK FREE) 0.004 % OP SOLN: OPHTHALMIC | 1 refills | Status: DC

## 2020-08-01 MED ORDER — METFORMIN HCL 500 MG PO TABS: 500.0000 mg | ORAL_TABLET | Freq: Two times a day (BID) | ORAL | 1 refills | Status: DC

## 2020-08-01 MED ORDER — CHLORTHALIDONE 25 MG PO TABS: 25.0000 mg | ORAL_TABLET | Freq: Every day | ORAL | 0 refills | Status: DC

## 2020-08-01 MED ORDER — CARVEDILOL 6.25 MG PO TABS: 6.2500 mg | ORAL_TABLET | Freq: Two times a day (BID) | ORAL | 0 refills | Status: DC

## 2020-08-01 MED ORDER — ATORVASTATIN CALCIUM 20 MG PO TABS: 20.0000 mg | ORAL_TABLET | Freq: Every day | ORAL | 1 refills | Status: DC

## 2020-08-01 MED FILL — METFORMIN HCL 500 MG TABS: 500 | 30 days supply | Qty: 60 | Fill #4

## 2020-08-01 MED FILL — ATORVASTATIN CALCIUM 20 MG: 20 | 90 days supply | Qty: 90 | Fill #0

## 2020-08-01 NOTE — Progress Notes (Signed)
Assessment & Plan:  Ronald Massey was seen today for diabetes and hypertension.  Diagnoses and all orders for this visit:  Type 2 diabetes mellitus with complication, without long-term current use of insulin (HCC) -     POCT glucose (manual entry) -     POCT glycosylated hemoglobin (Hb A1C) -     Microalbumin / creatinine urine ratio -     CMP14+EGFR  Essential hypertension -     CMP14+EGFR -     carvedilol (COREG) 6.25 MG tablet; Take 1 tablet (6.25 mg total) by mouth 2 (two) times daily with a meal. Please fill as a 90 day supply -     chlorthalidone (HYGROTON) 25 MG tablet; Take 1 tablet (25 mg total) by mouth daily. Please fill as a 90 day supply Continue all antihypertensives as prescribed.  Remember to bring in your blood pressure log with you for your follow up appointment.  DASH/Mediterranean Diets are healthier choices for HTN.   Hyperlipidemia associated with type 2 diabetes mellitus (HCC) -     Lipid panel -     atorvastatin (LIPITOR) 20 MG tablet; Take 1 tablet (20 mg total) by mouth daily. -     metFORMIN (GLUCOPHAGE) 500 MG tablet; Take 1 tablet (500 mg total) by mouth 2 (two) times daily with a meal. INSTRUCTIONS: Work on a low fat, heart healthy diet and participate in regular aerobic exercise program by working out at least 150 minutes per week; 5 days a week-30 minutes per day. Avoid red meat/beef/steak,  fried foods. junk foods, sodas, sugary drinks, unhealthy snacking, alcohol and smoking.  Drink at least 80 oz of water per day and monitor your carbohydrate intake daily.  INSTRUCTIONS: Work on a low fat, heart healthy diet and participate in regular aerobic exercise program by working out at least 150 minutes per week; 5 days a week-30 minutes per day. Avoid red meat/beef/steak,  fried foods. junk foods, sodas, sugary drinks, unhealthy snacking, alcohol and smoking.  Drink at least 80 oz of water per day and monitor your carbohydrate intake daily.   Screening for deficiency  anemia -     CBC  Colon cancer screening -     Fecal occult blood, imunochemical([facility])  Primary open angle glaucoma of both eyes, moderate stage -     Travoprost, BAK Free, (TRAVATAN Z) 0.004 % SOLN ophthalmic solution; PLACE 1 DROP INTO BOTH EYES AT BEDTIME.    Patient has been counseled on age-appropriate routine health concerns for screening and prevention. These are reviewed and up-to-date. Referrals have been placed accordingly. Immunizations are up-to-date or declined.    Subjective:   Chief Complaint  Patient presents with  . Diabetes  . Hypertension   HPI Ronald Massey 64 y.o. male presents to office today for follow up.  has a past medical history of Diabetes mellitus without complication ([place]), Hyperlipidemia, Hypertension, and Pollen allergies. He has been out of the country for several months in [place] visiting his relatives. Has not been compliant with taking his diabetes medication or his antihypertensives.    Essential Hypertension Slightly elevated today. Will not make any adjustments with his medications as he has not been adherent. Denies chest pain, shortness of breath, palpitations, lightheadedness, dizziness, headaches or BLE edema.  Will refill carvedilol 6.255 mg twice daily and chlorthalidone 25 mg daily. BP Readings from Last 3 Encounters:  08/01/20 (!) 146/79  09/07/19 (!) 153/82  09/14/18 130/80    DM TYPE 2 A1c increased from 6.5-7.0 currently.  He has not been taking Metformin 500 mg twice daily nor has he been monitoring his blood glucose levels daily.  Denies any symptoms of hypo or hyperglycemia.  LDL is not at goal with atorvastatin 20 mg daily.  There is no history of statin intolerance or myalgias.  May need to increase to 40 mg based on upcoming lipid profile. Lab Results  Component Value Date   HGBA1C 7.0 08/01/2020   Lab Results  Component Value Date   LDLCALC 128 (H) 04/12/2018   Review of Systems  Constitutional:  Negative for fever, malaise/fatigue and weight loss.  HENT: Negative.  Negative for nosebleeds.   Eyes: Negative.  Negative for blurred vision, double vision and photophobia.  Respiratory: Negative.  Negative for cough and shortness of breath.   Cardiovascular: Negative.  Negative for chest pain, palpitations and leg swelling.  Gastrointestinal: Negative.  Negative for heartburn, nausea and vomiting.  Musculoskeletal: Negative.  Negative for myalgias.  Neurological: Negative.  Negative for dizziness, focal weakness, seizures and headaches.  Psychiatric/Behavioral: Negative.  Negative for suicidal ideas.    Past Medical History:  Diagnosis Date  . Diabetes mellitus without complication ([place])   . Hyperlipidemia   . Hypertension   . Pollen allergies     History reviewed. No pertinent surgical history.  Family History  Problem Relation Age of Onset  . Hypertension Mother     Social History Reviewed with no changes to be made today.   Outpatient Medications Prior to Visit  Medication Sig Dispense Refill  . Blood Glucose Monitoring Suppl (CONTOUR NEXT EZ) w/Device KIT 1 Device by Does not apply route AC breakfast. (Patient not taking: Reported on 04/12/2018) 1 kit 0  . glucose blood (TRUE METRIX BLOOD GLUCOSE TEST) test strip Use as instructed (Patient not taking: Reported on 04/12/2018) 100 each 12  . MICROLET LANCETS MISC Use as instructed. (Patient not taking: Reported on 04/12/2018) 100 each 12  . atorvastatin (LIPITOR) 20 MG tablet Take 1 tablet (20 mg total) by mouth daily. 90 tablet 1  . carvedilol (COREG) 6.25 MG tablet TAKE 1 TABLET (6.25 MG TOTAL) BY MOUTH 2 (TWO) TIMES DAILY WITH A MEAL. 180 tablet 0  . chlorthalidone (HYGROTON) 25 MG tablet TAKE 1 TABLET (25 MG TOTAL) BY MOUTH DAILY. 90 tablet 0  . metFORMIN (GLUCOPHAGE) 500 MG tablet Take 1 tablet (500 mg total) by mouth 2 (two) times daily with a meal. 180 tablet 1  . TRAVATAN Z 0.004 % SOLN ophthalmic solution PLACE 1 DROP  INTO BOTH EYES AT BEDTIME. 2.5 mL 1   No facility-administered medications prior to visit.    No Known Allergies     Objective:    BP (!) 146/79   Pulse 90   Resp 16   Wt 172 lb 12.8 oz (78.4 kg)   SpO2 97%   BMI 26.27 kg/m  Wt Readings from Last 3 Encounters:  08/01/20 172 lb 12.8 oz (78.4 kg)  09/07/19 164 lb (74.4 kg)  09/14/18 174 lb (78.9 kg)    Physical Exam Vitals and nursing note reviewed.  Constitutional:      Appearance: He is well-developed.  HENT:     Head: Normocephalic and atraumatic.  Cardiovascular:     Rate and Rhythm: Normal rate and regular rhythm.     Heart sounds: Normal heart sounds. No murmur heard.  No friction rub. No gallop.   Pulmonary:     Effort: Pulmonary effort is normal. No tachypnea or respiratory distress.  Breath sounds: Normal breath sounds. No decreased breath sounds, wheezing, rhonchi or rales.  Chest:     Chest wall: No tenderness.  Abdominal:     General: Bowel sounds are normal.     Palpations: Abdomen is soft.  Musculoskeletal:        General: Normal range of motion.     Cervical back: Normal range of motion.  Skin:    General: Skin is warm and dry.  Neurological:     Mental Status: He is alert and oriented to person, place, and time.     Coordination: Coordination normal.  Psychiatric:        Behavior: Behavior normal. Behavior is cooperative.        Thought Content: Thought content normal.        Judgment: Judgment normal.          Patient has been counseled extensively about nutrition and exercise as well as the importance of adherence with medications and regular follow-up. The patient was given clear instructions to go to ER or return to medical center if symptoms don't improve, worsen or new problems develop. The patient verbalized understanding.   Follow-up: Return in about 3 months (around 10/31/2020).   Gildardo Pounds, FNP-BC [facility] [place],  [place]   08/01/2020, 2:40 PM

## 2020-08-02 LAB — CMP14+EGFR
ALT: 25 IU/L (ref 0–44)
AST: 21 IU/L (ref 0–40)
Albumin/Globulin Ratio: 1.4 (ref 1.2–2.2)
Albumin: 4.1 g/dL (ref 3.8–4.8)
Alkaline Phosphatase: 75 IU/L (ref 44–121)
BUN/Creatinine Ratio: 18 (ref 10–24)
BUN: 18 mg/dL (ref 8–27)
Bilirubin Total: 0.3 mg/dL (ref 0.0–1.2)
CO2: 26 mmol/L (ref 20–29)
Calcium: 9.3 mg/dL (ref 8.6–10.2)
Chloride: 101 mmol/L (ref 96–106)
Creatinine, Ser: 1 mg/dL (ref 0.76–1.27)
GFR calc Af Amer: 92 mL/min/{1.73_m2} (ref >59)
GFR calc non Af Amer: 80 mL/min/{1.73_m2} (ref >59)
Globulin, Total: 2.9 g/dL (ref 1.5–4.5)
Glucose: 236 mg/dL — ABNORMAL HIGH (ref 65–99)
Potassium: 4.4 mmol/L (ref 3.5–5.2)
Sodium: 141 mmol/L (ref 134–144)
Total Protein: 7 g/dL (ref 6.0–8.5)

## 2020-08-02 LAB — MICROALBUMIN / CREATININE URINE RATIO
Creatinine, Urine: 230.2 mg/dL
Microalb/Creat Ratio: 7 mg/g creat (ref 0–29)
Microalbumin, Urine: 16.1 ug/mL

## 2020-08-02 LAB — CBC
Hematocrit: 43.7 % (ref 37.5–51.0)
Hemoglobin: 15.1 g/dL (ref 13.0–17.7)
MCH: 31.6 pg (ref 26.6–33.0)
MCHC: 34.6 g/dL (ref 31.5–35.7)
MCV: 91 fL (ref 79–97)
Platelets: 190 10*3/uL (ref 150–450)
RBC: 4.78 x10E6/uL (ref 4.14–5.80)
RDW: 13 % (ref 11.6–15.4)
WBC: 4.3 10*3/uL (ref 3.4–10.8)

## 2020-08-02 LAB — LIPID PANEL
Chol/HDL Ratio: 5.1 ratio — ABNORMAL HIGH (ref 0.0–5.0)
Cholesterol, Total: 193 mg/dL (ref 100–199)
HDL: 38 mg/dL — ABNORMAL LOW (ref >39)
LDL Chol Calc (NIH): 122 mg/dL — ABNORMAL HIGH (ref 0–99)
Triglycerides: 188 mg/dL — ABNORMAL HIGH (ref 0–149)
VLDL Cholesterol Cal: 33 mg/dL (ref 5–40)

## 2020-08-07 DIAGNOSIS — E119 Type 2 diabetes mellitus without complications: Secondary | ICD-10-CM | POA: Insufficient documentation

## 2020-08-24 ENCOUNTER — Other Ambulatory Visit: Payer: Self-pay | Admitting: Ophthalmology

## 2020-08-24 MED FILL — OFLOXACIN 0.3% EYE DROPS: 0.3 | 30 days supply | Qty: 5 | Fill #0

## 2020-09-26 MED FILL — METFORMIN HCL 500 MG TABS: 500 | 30 days supply | Qty: 60 | Fill #0

## 2020-09-26 MED FILL — CHLORTHALIDONE 25 MG TAB: 25 | 30 days supply | Qty: 30 | Fill #0

## 2020-09-26 MED FILL — CARVEDILOL 6.25 MG TABLET: 6.25 | 90 days supply | Qty: 180 | Fill #0

## 2020-10-10 MED FILL — CARVEDILOL 6.25 MG TABLET: 6.25 | 90 days supply | Qty: 180 | Fill #0

## 2020-10-10 MED FILL — METFORMIN HCL 500 MG TABS: 500 | 30 days supply | Qty: 60 | Fill #0

## 2020-10-10 MED FILL — CHLORTHALIDONE 25 MG TAB: 25 | 30 days supply | Qty: 30 | Fill #0

## 2020-10-31 ENCOUNTER — Ambulatory Visit: Payer: 59 | Admitting: Nurse Practitioner

## 2020-11-28 ENCOUNTER — Ambulatory Visit: Payer: 59 | Admitting: Nurse Practitioner

## 2021-02-19 ENCOUNTER — Ambulatory Visit: Payer: 59 | Admitting: Nurse Practitioner

## 2021-03-12 ENCOUNTER — Ambulatory Visit: Payer: 59 | Admitting: Nurse Practitioner

## 2021-03-29 ENCOUNTER — Telehealth: Payer: Self-pay | Admitting: Nurse Practitioner

## 2021-03-29 NOTE — Telephone Encounter (Signed)
Patient has appt with Zelda on 6/1 but provider is working virtual. Called patient and left vm that he does not have to come into the office his appt will be virtual. If in person is preferred to call [phone] to reschedule for another day.

## 2021-04-03 ENCOUNTER — Telehealth: Payer: 59 | Admitting: Nurse Practitioner

## 2021-05-22 ENCOUNTER — Telehealth: Payer: 59 | Admitting: Nurse Practitioner

## 2021-05-24 ENCOUNTER — Ambulatory Visit: Payer: 59 | Attending: Nurse Practitioner | Admitting: Family Medicine

## 2021-05-24 ENCOUNTER — Other Ambulatory Visit: Payer: Self-pay

## 2021-05-24 VITALS — BP 144/87 | HR 70 | Resp 16 | Wt 171.0 lb

## 2021-05-24 DIAGNOSIS — Z125 Encounter for screening for malignant neoplasm of prostate: Secondary | ICD-10-CM

## 2021-05-24 DIAGNOSIS — E1169 Type 2 diabetes mellitus with other specified complication: Secondary | ICD-10-CM | POA: Diagnosis not present

## 2021-05-24 DIAGNOSIS — H401132 Primary open-angle glaucoma, bilateral, moderate stage: Secondary | ICD-10-CM | POA: Diagnosis not present

## 2021-05-24 DIAGNOSIS — I10 Essential (primary) hypertension: Secondary | ICD-10-CM | POA: Diagnosis not present

## 2021-05-24 DIAGNOSIS — E785 Hyperlipidemia, unspecified: Secondary | ICD-10-CM

## 2021-05-24 DIAGNOSIS — Z1211 Encounter for screening for malignant neoplasm of colon: Secondary | ICD-10-CM

## 2021-05-24 LAB — POCT GLYCOSYLATED HEMOGLOBIN (HGB A1C): HbA1c, POC (controlled diabetic range): 6.6 % (ref 0.0–7.0)

## 2021-05-24 MED ORDER — CARVEDILOL 6.25 MG PO TABS: 6.2500 mg | ORAL_TABLET | Freq: Two times a day (BID) | ORAL | 1 refills | Status: DC

## 2021-05-24 MED ORDER — CHLORTHALIDONE 25 MG PO TABS: 25.0000 mg | ORAL_TABLET | Freq: Every day | ORAL | 1 refills | Status: DC

## 2021-05-24 MED ORDER — ATORVASTATIN CALCIUM 20 MG PO TABS: 20.0000 mg | ORAL_TABLET | Freq: Every day | ORAL | 1 refills | Status: DC

## 2021-05-24 MED ORDER — METFORMIN HCL 500 MG PO TABS: 500.0000 mg | ORAL_TABLET | Freq: Two times a day (BID) | ORAL | 1 refills | Status: DC

## 2021-05-24 NOTE — Progress Notes (Signed)
Established Patient Office Visit  Subjective:  Patient ID: Ronald Massey, male    DOB: [birth year]  Age: 65 y.o. MRN: [id number]  CC:  Chief Complaint  Patient presents with   Diabetes   Hypertension    HPI Ronald Massey presents for diabetes and hypertension follow-up.  Diabetes / Hypertension (not at goal) Last A1c 7.0% x 10 months prior. Patient doesn't routinely monitor his blood sugar at home as he dislike sticking his finger. Reports incorporating routine physical activity of walking into his daily regimen most days of the week. He denies neuropathic pain, urine frequency, increased thirst, unintentional weight changes. He endorses diabetic appropriate diet changes. Reports compliance with metformin. Patient also suffers from hypertension. BP elevated today on arrival. He doesn't monitor BP at home. He denies swelling, CP, SOB, HA, dizziness, or HA.   BP Readings from Last 3 Encounters:  05/24/21 (!) 144/87  08/01/20 (!) 146/79  09/07/19 (!) 153/82       Past Medical History:  Diagnosis Date   Diabetes mellitus without complication ([place])    Hyperlipidemia    Hypertension    Pollen allergies     No past surgical history on file.  Family History  Problem Relation Age of Onset   Hypertension Mother     Social History   Socioeconomic History   Marital status: Married    Spouse name: Not on file   Number of children: Not on file   Years of education: Not on file   Highest education level: Not on file  Occupational History   Not on file  Tobacco Use   Smoking status: Never   Smokeless tobacco: Never  Substance and Sexual Activity   Alcohol use: No   Drug use: No   Sexual activity: Not on file  Other Topics Concern   Not on file  Social History Narrative   Not on file   Social Determinants of Health   Financial Resource Strain: Not on file  Food Insecurity: Not on file  Transportation Needs: Not on file  Physical Activity: Not on file  Stress:  Not on file  Social Connections: Not on file  Intimate Partner Violence: Not on file    Outpatient Medications Prior to Visit  Medication Sig Dispense Refill   atorvastatin (LIPITOR) 20 MG tablet Take 1 tablet (20 mg total) by mouth daily. 90 tablet 1   Blood Glucose Monitoring Suppl (CONTOUR NEXT EZ) w/Device KIT 1 Device by Does not apply route AC breakfast. (Patient not taking: Reported on 04/12/2018) 1 kit 0   carvedilol (COREG) 6.25 MG tablet Take 1 tablet (6.25 mg total) by mouth 2 (two) times daily with a meal. Please fill as a 90 day supply 180 tablet 0   chlorthalidone (HYGROTON) 25 MG tablet Take 1 tablet (25 mg total) by mouth daily. Please fill as a 90 day supply 90 tablet 0   glucose blood (TRUE METRIX BLOOD GLUCOSE TEST) test strip Use as instructed (Patient not taking: Reported on 04/12/2018) 100 each 12   metFORMIN (GLUCOPHAGE) 500 MG tablet Take 1 tablet (500 mg total) by mouth 2 (two) times daily with a meal. 180 tablet 1   MICROLET LANCETS MISC Use as instructed. (Patient not taking: Reported on 04/12/2018) 100 each 12   ofloxacin (OCUFLOX) 0.3 % ophthalmic solution INSTILL 1 DROP IN AFFECTED EYE(S) FOUR TIMES DAILY (Patient not taking: Reported on 05/24/2021) 5 mL 1   Travoprost, BAK Free, (TRAVATAN Z) 0.004 % SOLN ophthalmic solution  PLACE 1 DROP INTO BOTH EYES AT BEDTIME. (Patient not taking: Reported on 05/24/2021) 2.5 mL 1   No facility-administered medications prior to visit.    No Known Allergies  ROS Review of Systems Pertinent negatives listed in HPI    Objective:    Physical Exam BP (!) 144/87   Pulse 70   Resp 16   Wt 171 lb (77.6 kg)   SpO2 96%   BMI 26.00 kg/m    BP (!) 144/87   Pulse 70   Resp 16   Wt 171 lb (77.6 kg)   SpO2 96%   BMI 26.00 kg/m  General appearance: alert, cooperative, and appears stated age Head: Normocephalic, without obvious abnormality, atraumatic Eyes: conjunctivae/corneas clear. PERRL, EOM's intact. Fundi  benign. Lungs: clear to auscultation bilaterally Heart: regular rate and rhythm, S1, S2 normal, no murmur, click, rub or gallop Abdomen: soft, non-tender; bowel sounds normal; no masses,  no organomegaly Extremities: extremities normal, atraumatic, no cyanosis or edema Skin: Skin color, texture, turgor normal. No rashes or lesions Neurologic: Grossly normal    Health Maintenance Due  Topic Date Due   COVID-19 Vaccine (1) Never done   COLONOSCOPY (Pts 45-35yr Insurance coverage will need to be confirmed)  Never done   Zoster Vaccines- Shingrix (1 of 2) Never done   HEMOGLOBIN A1C  01/29/2021    There are no preventive care reminders to display for this patient.  Lab Results  Component Value Date   TSH 0.901 09/16/2013   Lab Results  Component Value Date   WBC 4.3 08/01/2020   HGB 15.1 08/01/2020   HCT 43.7 08/01/2020   MCV 91 08/01/2020   PLT 190 08/01/2020   Lab Results  Component Value Date   NA 141 08/01/2020   K 4.4 08/01/2020   CO2 26 08/01/2020   GLUCOSE 236 (H) 08/01/2020   BUN 18 08/01/2020   CREATININE 1.00 08/01/2020   BILITOT 0.3 08/01/2020   ALKPHOS 75 08/01/2020   AST 21 08/01/2020   ALT 25 08/01/2020   PROT 7.0 08/01/2020   ALBUMIN 4.1 08/01/2020   CALCIUM 9.3 08/01/2020   ANIONGAP 9 02/08/2016   Lab Results  Component Value Date   CHOL 193 08/01/2020   Lab Results  Component Value Date   HDL 38 (L) 08/01/2020   Lab Results  Component Value Date   LDLCALC 122 (H) 08/01/2020   Lab Results  Component Value Date   TRIG 188 (H) 08/01/2020   Lab Results  Component Value Date   CHOLHDL 5.1 (H) 08/01/2020   Lab Results  Component Value Date   HGBA1C 7.0 08/01/2020      Assessment & Plan:   Problem List Items Addressed This Visit       Cardiovascular and Mediastinum   Essential hypertension   Relevant Orders   CMP14+EGFR   CBC with Differential     Endocrine   Hyperlipidemia associated with type 2 diabetes mellitus ([place] -  Primary   Relevant Orders   HM DIABETES FOOT EXAM (Completed)   HgB A1c   Lipid panel   Other Visit Diagnoses     Primary open angle glaucoma of both eyes, moderate stage       Colon cancer screening           Meds ordered this encounter  Medications   atorvastatin (LIPITOR) 20 MG tablet    Sig: Take 1 tablet (20 mg total) by mouth daily.    Dispense:  90 tablet  Refill:  1    Please fill as a 90 day supply   chlorthalidone (HYGROTON) 25 MG tablet    Sig: Take 1 tablet (25 mg total) by mouth daily. Please fill as a 90 day supply    Dispense:  90 tablet    Refill:  1   metFORMIN (GLUCOPHAGE) 500 MG tablet    Sig: Take 1 tablet (500 mg total) by mouth 2 (two) times daily with a meal.    Dispense:  180 tablet    Refill:  1    Please fill as a 90 day supply   carvedilol (COREG) 6.25 MG tablet    Sig: Take 1 tablet (6.25 mg total) by mouth 2 (two) times daily with a meal. Please fill as a 90 day supply    Dispense:  180 tablet    Refill:  1    Follow-up: Six months hypertension and DM follow-up    Molli Barrows, FNP

## 2021-05-24 NOTE — Patient Instructions (Signed)
Purchase Eurcerin cream to apply to dry skin on feet on legs.  Your A1C looks great.   See you back in 6 months.

## 2021-05-25 LAB — CMP14+EGFR
ALT: 21 IU/L (ref 0–44)
AST: 22 IU/L (ref 0–40)
Albumin/Globulin Ratio: 1.7 (ref 1.2–2.2)
Albumin: 4.6 g/dL (ref 3.8–4.8)
Alkaline Phosphatase: 70 IU/L (ref 44–121)
BUN/Creatinine Ratio: 15 (ref 10–24)
BUN: 16 mg/dL (ref 8–27)
Bilirubin Total: 0.7 mg/dL (ref 0.0–1.2)
CO2: 25 mmol/L (ref 20–29)
Calcium: 9.6 mg/dL (ref 8.6–10.2)
Chloride: 99 mmol/L (ref 96–106)
Creatinine, Ser: 1.09 mg/dL (ref 0.76–1.27)
Globulin, Total: 2.7 g/dL (ref 1.5–4.5)
Glucose: 113 mg/dL — ABNORMAL HIGH (ref 65–99)
Potassium: 4.4 mmol/L (ref 3.5–5.2)
Sodium: 135 mmol/L (ref 134–144)
Total Protein: 7.3 g/dL (ref 6.0–8.5)
eGFR: 76 mL/min/1.73

## 2021-05-25 LAB — CBC WITH DIFFERENTIAL/PLATELET
Basophils Absolute: 0 10*3/uL (ref 0.0–0.2)
Basos: 0 %
EOS (ABSOLUTE): 0.1 10*3/uL (ref 0.0–0.4)
Eos: 2 %
Hematocrit: 46 % (ref 37.5–51.0)
Hemoglobin: 15.8 g/dL (ref 13.0–17.7)
Immature Grans (Abs): 0 10*3/uL (ref 0.0–0.1)
Immature Granulocytes: 0 %
Lymphocytes Absolute: 2.8 10*3/uL (ref 0.7–3.1)
Lymphs: 49 %
MCH: 31.2 pg (ref 26.6–33.0)
MCHC: 34.3 g/dL (ref 31.5–35.7)
MCV: 91 fL (ref 79–97)
Monocytes Absolute: 0.5 10*3/uL (ref 0.1–0.9)
Monocytes: 8 %
Neutrophils Absolute: 2.4 10*3/uL (ref 1.4–7.0)
Neutrophils: 41 %
Platelets: 209 10*3/uL (ref 150–450)
RBC: 5.07 x10E6/uL (ref 4.14–5.80)
RDW: 12.5 % (ref 11.6–15.4)
WBC: 5.7 10*3/uL (ref 3.4–10.8)

## 2021-05-25 LAB — LIPID PANEL
Chol/HDL Ratio: 4.4 ratio (ref 0.0–5.0)
Cholesterol, Total: 209 mg/dL — ABNORMAL HIGH (ref 100–199)
HDL: 47 mg/dL (ref >39)
LDL Chol Calc (NIH): 141 mg/dL — ABNORMAL HIGH (ref 0–99)
Triglycerides: 115 mg/dL (ref 0–149)
VLDL Cholesterol Cal: 21 mg/dL (ref 5–40)

## 2021-05-25 LAB — PSA: Prostate Specific Ag, Serum: 0.5 ng/mL (ref 0.0–4.0)

## 2021-05-31 ENCOUNTER — Telehealth: Payer: Self-pay

## 2021-05-31 NOTE — Telephone Encounter (Signed)
-----   Message from Bing Neighbors, FNP sent at 05/28/2021 10:53 PM EDT ----- Notify patient prostate level within expected range. Cholesterol is not at goal, increase atorvastatin 40 mg daily (take 2 of 20 mg tablets after dinner) Liver and kidney function stable Return in 6 months

## 2021-05-31 NOTE — Telephone Encounter (Signed)
Patient name and DOB has been verified Patient was informed of lab results. Patient had no questions.  

## 2021-08-19 ENCOUNTER — Telehealth: Payer: Self-pay | Admitting: *Deleted

## 2021-08-19 NOTE — Telephone Encounter (Signed)
Copied from CRM [id number]. Topic: General - Other >> Aug 13, 2021  3:58 PM Gwenlyn Fudge wrote: Reason for CRM: Pt called stating that he received a call from office. He states that there was no VM and is requesting to have a call back in regards to it. Please advise.

## 2021-11-15 ENCOUNTER — Other Ambulatory Visit: Payer: Self-pay | Admitting: Family Medicine

## 2021-11-15 DIAGNOSIS — I10 Essential (primary) hypertension: Secondary | ICD-10-CM

## 2021-11-15 DIAGNOSIS — E1169 Type 2 diabetes mellitus with other specified complication: Secondary | ICD-10-CM

## 2021-11-25 ENCOUNTER — Ambulatory Visit: Payer: Medicaid Other | Admitting: Nurse Practitioner

## 2021-12-24 ENCOUNTER — Other Ambulatory Visit: Payer: Self-pay | Admitting: Family Medicine

## 2021-12-24 DIAGNOSIS — E1169 Type 2 diabetes mellitus with other specified complication: Secondary | ICD-10-CM

## 2021-12-24 DIAGNOSIS — E785 Hyperlipidemia, unspecified: Secondary | ICD-10-CM

## 2021-12-24 DIAGNOSIS — I10 Essential (primary) hypertension: Secondary | ICD-10-CM

## 2021-12-24 NOTE — Telephone Encounter (Signed)
NEEDS OFFICE VISIT. NOT TELE NOT VIRTUAL.

## 2022-02-27 ENCOUNTER — Ambulatory Visit (INDEPENDENT_AMBULATORY_CARE_PROVIDER_SITE_OTHER): Payer: Medicare Other | Admitting: Family Medicine

## 2022-02-27 ENCOUNTER — Encounter: Payer: Self-pay | Admitting: Family Medicine

## 2022-02-27 VITALS — BP 150/82 | HR 69 | Temp 96.8°F | Ht 68.0 in | Wt 174.0 lb

## 2022-02-27 DIAGNOSIS — M79602 Pain in left arm: Secondary | ICD-10-CM | POA: Diagnosis not present

## 2022-02-27 DIAGNOSIS — I10 Essential (primary) hypertension: Secondary | ICD-10-CM | POA: Diagnosis not present

## 2022-02-27 DIAGNOSIS — L819 Disorder of pigmentation, unspecified: Secondary | ICD-10-CM

## 2022-02-27 DIAGNOSIS — E1139 Type 2 diabetes mellitus with other diabetic ophthalmic complication: Secondary | ICD-10-CM | POA: Diagnosis not present

## 2022-02-27 DIAGNOSIS — E1169 Type 2 diabetes mellitus with other specified complication: Secondary | ICD-10-CM | POA: Diagnosis not present

## 2022-02-27 DIAGNOSIS — E785 Hyperlipidemia, unspecified: Secondary | ICD-10-CM | POA: Diagnosis not present

## 2022-02-27 DIAGNOSIS — M25512 Pain in left shoulder: Secondary | ICD-10-CM

## 2022-02-27 LAB — BASIC METABOLIC PANEL
BUN: 19 mg/dL (ref 6–23)
CO2: 27 mEq/L (ref 19–32)
Calcium: 8.8 mg/dL (ref 8.4–10.5)
Chloride: 103 mEq/L (ref 96–112)
Creatinine, Ser: 0.86 mg/dL (ref 0.40–1.50)
GFR: 90.99 mL/min (ref >60.00)
Glucose, Bld: 151 mg/dL — ABNORMAL HIGH (ref 70–99)
Potassium: 4.1 mEq/L (ref 3.5–5.1)
Sodium: 138 mEq/L (ref 135–145)

## 2022-02-27 LAB — LIPID PANEL
Cholesterol: 137 mg/dL (ref 0–200)
HDL: 38.3 mg/dL — ABNORMAL LOW (ref >39.00)
LDL Cholesterol: 73 mg/dL (ref 0–99)
NonHDL: 98.58
Total CHOL/HDL Ratio: 4
Triglycerides: 130 mg/dL (ref 0.0–149.0)
VLDL: 26 mg/dL (ref 0.0–40.0)

## 2022-02-27 LAB — HEMOGLOBIN A1C: Hgb A1c MFr Bld: 8.4 % — ABNORMAL HIGH (ref 4.6–6.5)

## 2022-02-27 MED ORDER — TRIAMCINOLONE ACETONIDE 0.1 % EX OINT: TOPICAL_OINTMENT | Freq: Two times a day (BID) | CUTANEOUS | 3 refills | Status: DC

## 2022-02-27 MED ORDER — KETOROLAC TROMETHAMINE 30 MG/ML IJ SOLN: 30.0000 mg | Freq: Once | INTRAMUSCULAR | 0 refills | Status: DC

## 2022-02-27 MED ORDER — KETOROLAC TROMETHAMINE 60 MG/2ML IM SOLN
30.0000 mg | Freq: Once | INTRAMUSCULAR | Status: AC
  Administered 2022-02-27: 30 mg via INTRAMUSCULAR

## 2022-02-27 MED ORDER — IBUPROFEN 800 MG PO TABS: 800.0000 mg | ORAL_TABLET | Freq: Three times a day (TID) | ORAL | 0 refills | Status: DC | PRN

## 2022-02-27 NOTE — Assessment & Plan Note (Signed)
Unstable ?Chronic ?On chlorthalidone 25 mg, carvedilol 6.25 mg twice daily ?Check CMP ?Follow-up in 1 month ?

## 2022-02-27 NOTE — Addendum Note (Signed)
Addended by: Willaim Bane on: 02/27/2022 04:50 PM ? ? Modules accepted: Orders ? ?

## 2022-02-27 NOTE — Assessment & Plan Note (Signed)
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

## 2022-02-27 NOTE — Assessment & Plan Note (Signed)
On atorvastatin 20 mg daily Check lipid panel 

## 2022-02-27 NOTE — Assessment & Plan Note (Signed)
Hemoglobin A1c has been under 7 approximately 1 year ago ?Recheck hemoglobin A1c ?Continue metformin ?

## 2022-02-27 NOTE — Patient Instructions (Signed)
For your left shoulder pain, you got a Toradol shot.  We are sending in ibuprofen.  Take this with food. 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 ?Please follow-up in 1 month to discuss blood pressure. ?For your rash, try the topical triamcinolone.  If you do not have any improvement we can do a biopsy at next visit. ?

## 2022-02-27 NOTE — Assessment & Plan Note (Signed)
Unsure of exact nature ?Does appear to be progressive and diffuse in lower extremities ?No systemic inflammatory or autoimmune symptoms in history ?Does not appear to be in the vitiligo or tinea versicolor distribution ?Trial topical triamcinolone 0.1% twice daily for 1 month ?Continue to monitor, will likely do punch biopsy at next appointment if no improvement ?

## 2022-02-27 NOTE — Addendum Note (Signed)
Addended by: Philipp Deputy on: 02/27/2022 05:00 PM ? ? Modules accepted: Orders ? ?

## 2022-02-27 NOTE — Progress Notes (Signed)
? ?New Patient Office Visit ? ?Subjective   ? ?Patient ID: Ronald Massey, male    DOB: [birth year]  Age: 66 y.o. MRN: [id number] ? ?CC:  ?Chief Complaint  ?Patient presents with  ? Establish Care  ?  Np est care. C/o starts with left shoulder down to left hand x 3 years. Wants to discuss light spots on both legs.  ? ? ?HPI ?Ronald Massey presents to establish care ? ?Left shoulder pain.  Patient reports that he was lying on the other day and discharge hurting of nowhere.  The pain does radiate down his hand.  Is been going on for 3 days.  It is worsening.  Pain is improved with ibuprofen is over-the-counter.  He has also tried icing.  Denies any injury to it.  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  Denies any weakness in this arm except due to pain. ? ?Leg Rash.  Bilateral.  Patient said over the past several months he is developed these white spots on his legs.  They are not flaky, they do not itch, they are not painful denies any other systemic symptoms including other rash, abdominal discomfort, blood in stool, chest pain, shortness of breath.  He has not tried anything for this. ? ?DMT2.  Patient takes metformin.  Does not endorse any symptoms of hypoglycemia.  Last years hemoglobin A1c was less than 7. ? ?HTN.  Patient takes chlorthalidone and carvedilol.  Denies any shortness of breath, chest pain, lower extremity swelling.  ? ?Patient has glaucoma, he is followed with ophthalmology in the past.  He uses travoprost drops.  Says they are expensive and he has not been using them for a while. ? ?Outpatient Encounter Medications as of 02/27/2022  ?Medication Sig  ? atorvastatin (LIPITOR) 20 MG tablet Take 1 tablet by mouth once daily  ? carvedilol (COREG) 6.25 MG tablet TAKE 1 TABLET BY MOUTH TWICE DAILY WITH MEALS  ? chlorthalidone (HYGROTON) 25 MG tablet Take 1 tablet by mouth once daily  ? ibuprofen (ADVIL) 800 MG tablet Take 1 tablet (800 mg total) by mouth every 8 (eight) hours  as needed.  ? ketorolac (TORADOL) 30 MG/ML injection Inject 1 mL (30 mg total) into the vein once for 1 dose.  ? metFORMIN (GLUCOPHAGE) 500 MG tablet TAKE 1 TABLET BY MOUTH TWICE DAILY WITH A MEAL  ? Travoprost, BAK Free, (TRAVATAN Z) 0.004 % SOLN ophthalmic solution PLACE 1 DROP INTO BOTH EYES AT BEDTIME.  ? triamcinolone ointment (KENALOG) 0.1 % Apply topically 2 (two) times daily.  ? Blood Glucose Monitoring Suppl (CONTOUR NEXT EZ) w/Device KIT 1 Device by Does not apply route AC breakfast. (Patient not taking: Reported on 04/12/2018)  ? glucose blood (TRUE METRIX BLOOD GLUCOSE TEST) test strip Use as instructed (Patient not taking: Reported on 04/12/2018)  ? MICROLET LANCETS MISC Use as instructed. (Patient not taking: Reported on 04/12/2018)  ? ?No facility-administered encounter medications on file as of 02/27/2022.  ? ? ?Past Medical History:  ?Diagnosis Date  ? Dental cavities 12/25/2014  ? Diabetes mellitus without complication ([place])   ? Hyperlipidemia   ? Hypertension   ? Onychomycosis of toenail 03/04/2016  ? Pollen allergies   ? Tinea pedis of both feet 03/04/2016  ? ? ?Past Surgical History:  ?Procedure Laterality Date  ? EYE SURGERY    ? ? ?Family History  ?Problem Relation Age of Onset  ? Hypertension Mother   ? ? ?Social  History  ? ?Socioeconomic History  ? Marital status: Married  ?  Spouse name: Not on file  ? Number of children: Not on file  ? Years of education: Not on file  ? Highest education level: Not on file  ?Occupational History  ? Not on file  ?Tobacco Use  ? Smoking status: Never  ? Smokeless tobacco: Never  ?Vaping Use  ? Vaping Use: Never used  ?Substance and Sexual Activity  ? Alcohol use: No  ? Drug use: No  ? Sexual activity: Not on file  ?Other Topics Concern  ? Not on file  ?Social History Narrative  ? Not on file  ? ?Social Determinants of Health  ? ?Financial Resource Strain: Not on file  ?Food Insecurity: Not on file  ?Transportation Needs: Not on file  ?Physical Activity: Not on file   ?Stress: Not on file  ?Social Connections: Not on file  ?Intimate Partner Violence: Not on file  ? ? ?ROS ?As per HPI ?  ? ? ?Objective   ? ?BP (!) 150/82 (BP Location: Right Arm, Patient Position: Sitting, Cuff Size: Normal)   Pulse 69   Temp (!) 96.8 ?F (36 ?C) (Temporal)   Ht _0  (1.727 m)   Wt 174 lb (78.9 kg)   SpO2 97%   BMI 26.46 kg/m?  ? ?Gen: NAD, resting comfortably ?CV: RRR with no murmurs appreciated ?Pulm: NWOB, CTAB with no crackles, wheezes, or rhonchi ?GI: Normal bowel sounds present. Soft, Nontender, Nondistended. ?MSK: no edema, cyanosis, or clubbing noted, left shoulder blade tender along the medial aspect, normal external rotation and internal rotation of shoulder, 5 of 5 strength in biceps and triceps, 5 of 5 strength in grip, normal right arm throughout ?Skin: Several hypopigmented macules of approximately 2 to 5 mm in diameter diffusely scattered across lower extremities from the shin to the ankle, there is no flaking, they are nontender, there is no erythema,  ?Neuro: grossly normal, moves all extremities ?Psych: Normal affect and thought content ? ? ? ?  ? ?Assessment & Plan:  ? ?Problem List Items Addressed This Visit   ? ?  ? Cardiovascular and Mediastinum  ? Essential hypertension  ? Relevant Orders  ? Hemoglobin A1C  ? Basic Metabolic Panel (BMET)  ?  ? Endocrine  ? Hyperlipidemia associated with type 2 diabetes mellitus ([place])  ? Relevant Orders  ? Lipid Profile  ? Type 2 diabetes mellitus ([place])  ? Relevant Orders  ? Hemoglobin A1C  ? Basic Metabolic Panel (BMET)  ? ?Other Visit Diagnoses   ? ? Left arm pain    -  Primary  ? Relevant Medications  ? ketorolac (TORADOL) 30 MG/ML injection  ? ibuprofen (ADVIL) 800 MG tablet  ? Hypopigmentation      ? Relevant Medications  ? triamcinolone ointment (KENALOG) 0.1 %  ? Primary hypertension      ? ?  ? ? ?Return in about 4 weeks (around 03/27/2022) for BP, DM.  ? ?Bonnita Hollow, MD ? ? ?

## 2022-03-27 ENCOUNTER — Ambulatory Visit: Payer: Medicare Other | Admitting: Family Medicine

## 2022-03-27 ENCOUNTER — Telehealth: Payer: Self-pay | Admitting: Family Medicine

## 2022-03-27 NOTE — Telephone Encounter (Signed)
Pt was a no show for his OV with Dr. Janee Morn on 03/27/22, I sent a no show letter.

## 2022-04-11 NOTE — Telephone Encounter (Signed)
1st no show, fee waived ?

## 2022-04-14 ENCOUNTER — Other Ambulatory Visit: Payer: Self-pay | Admitting: Nurse Practitioner

## 2022-04-14 ENCOUNTER — Other Ambulatory Visit: Payer: Self-pay

## 2022-04-14 DIAGNOSIS — H401132 Primary open-angle glaucoma, bilateral, moderate stage: Secondary | ICD-10-CM

## 2022-04-15 NOTE — Telephone Encounter (Signed)
Requested medications are due for refill today.  unsure  Requested medications are on the active medications list.  yes  Last refill. 08/01/2020 2.29mL 1 rf  Future visit scheduled.   yes  Notes to clinic.  PCP listed Fanny Bien.    Requested Prescriptions  Pending Prescriptions Disp Refills   Travoprost, BAK Free, (TRAVATAN Z) 0.004 % SOLN ophthalmic solution 2.5 mL 1    Sig: PLACE 1 DROP INTO BOTH EYES AT BEDTIME.     Ophthalmology:  Glaucoma Passed - 04/14/2022  1:42 PM      Passed - Valid encounter within last 12 months    Recent Outpatient Visits           10 months ago Hyperlipidemia associated with type 2 diabetes mellitus [facility])   [place] [facility] [place], Godfrey Pick, FNP   1 year ago Type 2 diabetes mellitus with complication, without long-term current use of insulin [facility])   [place] [facility] [place], [place] W, NP   2 years ago Hyperlipidemia associated with type 2 diabetes mellitus (HCC)   [place] [facility] Hoy Register, MD   3 years ago Essential hypertension   [place] [facility] [place], [place] W, NP   4 years ago Hyperlipidemia associated with type 2 diabetes mellitus [facility])   [place] [facility] Claiborne Rigg, NP       Future Appointments             In 3 weeks Claiborne Rigg, NP [facility]

## 2022-04-16 ENCOUNTER — Other Ambulatory Visit: Payer: Self-pay

## 2022-04-22 ENCOUNTER — Other Ambulatory Visit: Payer: Self-pay

## 2022-04-29 ENCOUNTER — Other Ambulatory Visit: Payer: Self-pay

## 2022-04-29 MED ORDER — TRAVOPROST (BAK FREE) 0.004 % OP SOLN
OPHTHALMIC | 3 refills | Status: DC
  Filled 2022-04-29: qty 2.5, 25d supply, fill #0

## 2022-05-01 ENCOUNTER — Other Ambulatory Visit: Payer: Self-pay

## 2022-05-01 MED ORDER — TRAVOPROST (BAK FREE) 0.004 % OP SOLN
OPHTHALMIC | 3 refills | Status: DC
  Filled 2022-05-01: qty 2.5, 20d supply, fill #0
  Filled 2022-06-05 – 2022-06-13 (×2): qty 2.5, 20d supply, fill #1
  Filled 2022-12-08: qty 2.5, 20d supply, fill #2

## 2022-05-07 ENCOUNTER — Ambulatory Visit: Payer: Medicare Other | Attending: Nurse Practitioner | Admitting: Nurse Practitioner

## 2022-05-07 ENCOUNTER — Other Ambulatory Visit: Payer: Self-pay | Admitting: Pharmacist

## 2022-05-07 ENCOUNTER — Other Ambulatory Visit: Payer: Self-pay

## 2022-05-07 ENCOUNTER — Encounter: Payer: Self-pay | Admitting: Nurse Practitioner

## 2022-05-07 VITALS — BP 116/76 | HR 77 | Temp 97.9°F | Ht 68.0 in | Wt 169.0 lb

## 2022-05-07 DIAGNOSIS — Z91119 Patient's noncompliance with dietary regimen due to unspecified reason: Secondary | ICD-10-CM | POA: Diagnosis not present

## 2022-05-07 DIAGNOSIS — E1165 Type 2 diabetes mellitus with hyperglycemia: Secondary | ICD-10-CM | POA: Diagnosis not present

## 2022-05-07 DIAGNOSIS — Z79899 Other long term (current) drug therapy: Secondary | ICD-10-CM | POA: Diagnosis not present

## 2022-05-07 DIAGNOSIS — E785 Hyperlipidemia, unspecified: Secondary | ICD-10-CM | POA: Diagnosis not present

## 2022-05-07 DIAGNOSIS — Z91148 Patient's other noncompliance with medication regimen for other reason: Secondary | ICD-10-CM | POA: Insufficient documentation

## 2022-05-07 DIAGNOSIS — E118 Type 2 diabetes mellitus with unspecified complications: Secondary | ICD-10-CM

## 2022-05-07 DIAGNOSIS — I10 Essential (primary) hypertension: Secondary | ICD-10-CM

## 2022-05-07 DIAGNOSIS — E1169 Type 2 diabetes mellitus with other specified complication: Secondary | ICD-10-CM

## 2022-05-07 DIAGNOSIS — Z7985 Long-term (current) use of injectable non-insulin antidiabetic drugs: Secondary | ICD-10-CM | POA: Diagnosis not present

## 2022-05-07 DIAGNOSIS — Z7984 Long term (current) use of oral hypoglycemic drugs: Secondary | ICD-10-CM | POA: Insufficient documentation

## 2022-05-07 LAB — POCT GLYCOSYLATED HEMOGLOBIN (HGB A1C): HbA1c, POC (controlled diabetic range): 8.6 % — AB (ref 0.0–7.0)

## 2022-05-07 LAB — GLUCOSE, POCT (MANUAL RESULT ENTRY): POC Glucose: 157 mg/dl — AB (ref 70–99)

## 2022-05-07 MED ORDER — MICROLET LANCETS MISC: 2 refills | Status: DC

## 2022-05-07 MED ORDER — MICROLET LANCETS MISC
12 refills | Status: DC
  Filled 2022-05-07: qty 100, 50d supply, fill #0

## 2022-05-07 MED ORDER — METFORMIN HCL 500 MG PO TABS
500.0000 mg | ORAL_TABLET | Freq: Two times a day (BID) | ORAL | 1 refills | Status: DC
  Filled 2022-05-07: qty 60, 30d supply, fill #0
  Filled 2022-06-04: qty 60, 30d supply, fill #1
  Filled 2022-07-09: qty 60, 30d supply, fill #2
  Filled 2022-08-05: qty 60, 30d supply, fill #3
  Filled 2022-09-03: qty 60, 30d supply, fill #4

## 2022-05-07 MED ORDER — CHLORTHALIDONE 25 MG PO TABS
25.0000 mg | ORAL_TABLET | Freq: Every day | ORAL | 1 refills | Status: DC
  Filled 2022-05-07: qty 30, 30d supply, fill #0
  Filled 2022-06-04: qty 30, 30d supply, fill #1
  Filled 2022-07-09: qty 30, 30d supply, fill #2
  Filled 2022-08-05: qty 30, 30d supply, fill #3
  Filled 2022-09-03: qty 30, 30d supply, fill #4

## 2022-05-07 MED ORDER — TRUE METRIX BLOOD GLUCOSE TEST VI STRP
ORAL_STRIP | 12 refills | Status: DC
  Filled 2022-05-07: qty 100, fill #0

## 2022-05-07 MED ORDER — TRULICITY 0.75 MG/0.5ML ~~LOC~~ SOAJ
0.7500 mg | SUBCUTANEOUS | 0 refills | Status: DC
  Filled 2022-05-07: qty 2, 28d supply, fill #0
  Filled 2022-06-04: qty 2, 28d supply, fill #1
  Filled 2022-07-09: qty 2, 28d supply, fill #2

## 2022-05-07 MED ORDER — CONTOUR NEXT ONE DEVI: 0 refills | Status: DC

## 2022-05-07 MED ORDER — ATORVASTATIN CALCIUM 20 MG PO TABS
20.0000 mg | ORAL_TABLET | Freq: Every day | ORAL | 0 refills | Status: DC
  Filled 2022-05-07: qty 30, 30d supply, fill #0
  Filled 2022-06-04: qty 30, 30d supply, fill #1
  Filled 2022-07-09: qty 30, 30d supply, fill #2

## 2022-05-07 MED ORDER — CONTOUR NEXT TEST VI STRP: ORAL_STRIP | 12 refills | Status: DC

## 2022-05-07 NOTE — Progress Notes (Signed)
Needs refills on medications. 

## 2022-05-07 NOTE — Progress Notes (Signed)
Assessment & Plan:  Ronald Massey was seen today for hypertension.  Diagnoses and all orders for this visit:  Essential hypertension -     chlorthalidone (HYGROTON) 25 MG tablet; Take 1 tablet (25 mg total) by mouth daily.  Type 2 diabetes mellitus with complication, without long-term current use of insulin (HCC) -     POCT glucose (manual entry) -     POCT glycosylated hemoglobin (Hb A1C) -     Dulaglutide (TRULICITY) 7.82 NF/6.2ZH SOPN; Inject 0.75 mg into the skin once a week. -     Discontinue: glucose blood (TRUE METRIX BLOOD GLUCOSE TEST) test strip; Use as instructed. Check blood glucose level by fingerstick twice per day. -     Discontinue: Microlet Lancets MISC; Use as instructed. Check blood glucose level by fingerstick twice per day.  Hyperlipidemia associated with type 2 diabetes mellitus (HCC) -     atorvastatin (LIPITOR) 20 MG tablet; Take 1 tablet (20 mg total) by mouth daily. -     metFORMIN (GLUCOPHAGE) 500 MG tablet; Take 1 tablet (500 mg total) by mouth 2 (two) times daily with a meal. -     Discontinue: Microlet Lancets MISC; Use as instructed. Check blood glucose level by fingerstick twice per day.    Patient has been counseled on age-appropriate routine health concerns for screening and prevention. These are reviewed and up-to-date. Referrals have been placed accordingly. Immunizations are up-to-date or declined.    Subjective:   Chief Complaint  Patient presents with   Hypertension   HPI Ronald Massey 66 y.o. male presents to office today for follow up to HTN and DM.   HTN Blood pressure is well controlled.  He is only taking chlorthalidone 25 mg daily at this time.  States he has not been taking carvedilol 6.25 times daily BP Readings from Last 3 Encounters:  05/07/22 116/76  02/27/22 (!) 150/82  05/24/21 (!) 144/87    DM 2 Diabetes is poorly controlled.  He endorses dietary nonadherence as well as medication nonadherence.  Has only been taking  metformin 500 mg once daily instead of twice daily.  We will start low-dose Trulicity today with goal of A1c 6.0 or less.  LDL not quite at goal.  He does state he has not been taking his atorvastatin 20 mg daily as prescribed.  He is also not monitoring his blood glucose levels daily. Lab Results  Component Value Date   HGBA1C 8.6 (A) 05/07/2022    Lab Results  Component Value Date   LDLCALC 73 02/27/2022    Review of Systems  Constitutional:  Negative for fever, malaise/fatigue and weight loss.  HENT: Negative.  Negative for nosebleeds.   Eyes: Negative.  Negative for blurred vision, double vision and photophobia.  Respiratory: Negative.  Negative for cough and shortness of breath.   Cardiovascular: Negative.  Negative for chest pain, palpitations and leg swelling.  Gastrointestinal: Negative.  Negative for heartburn, nausea and vomiting.  Musculoskeletal: Negative.  Negative for myalgias.  Neurological: Negative.  Negative for dizziness, focal weakness, seizures and headaches.  Psychiatric/Behavioral: Negative.  Negative for suicidal ideas.     Past Medical History:  Diagnosis Date   Dental cavities 12/25/2014   Diabetes mellitus without complication ([place])    Hyperlipidemia    Hypertension    Onychomycosis of toenail 03/04/2016   Pollen allergies    Tinea pedis of both feet 03/04/2016    Past Surgical History:  Procedure Laterality Date   EYE SURGERY  Family History  Problem Relation Age of Onset   Hypertension Mother     Social History Reviewed with no changes to be made today.   Outpatient Medications Prior to Visit  Medication Sig Dispense Refill   carvedilol (COREG) 6.25 MG tablet TAKE 1 TABLET BY MOUTH TWICE DAILY WITH MEALS 60 tablet 0   celecoxib (CELEBREX) 200 MG capsule Take by mouth.     Travoprost, BAK Free, (TRAVATAN Z) 0.004 % SOLN ophthalmic solution PLACE 1 DROP INTO BOTH EYES AT BEDTIME. 2.5 mL 1   Travoprost, BAK Free, (TRAVATAN) 0.004 % SOLN  ophthalmic solution Instill 1 drop into both eye every night as directed. 2.5 mL 3   triamcinolone ointment (KENALOG) 0.1 % Apply topically 2 (two) times daily. 60 g 3   atorvastatin (LIPITOR) 20 MG tablet Take 1 tablet by mouth once daily 90 tablet 0   Blood Glucose Monitoring Suppl (CONTOUR NEXT EZ) w/Device KIT 1 Device by Does not apply route AC breakfast. 1 kit 0   chlorthalidone (HYGROTON) 25 MG tablet Take 1 tablet by mouth once daily 30 tablet 0   glucose blood (TRUE METRIX BLOOD GLUCOSE TEST) test strip Use as instructed 100 each 12   ibuprofen (ADVIL) 800 MG tablet Take 1 tablet (800 mg total) by mouth every 8 (eight) hours as needed. 30 tablet 0   metFORMIN (GLUCOPHAGE) 500 MG tablet TAKE 1 TABLET BY MOUTH TWICE DAILY WITH A MEAL 60 tablet 0   MICROLET LANCETS MISC Use as instructed. 100 each 12   No facility-administered medications prior to visit.    No Known Allergies     Objective:    BP 116/76   Pulse 77   Temp 97.9 F (36.6 C) (Oral)   Ht $R'5\' 8"'cu$  (1.727 m)   Wt 169 lb (76.7 kg)   SpO2 97%   BMI 25.70 kg/m  Wt Readings from Last 3 Encounters:  05/07/22 169 lb (76.7 kg)  02/27/22 174 lb (78.9 kg)  05/24/21 171 lb (77.6 kg)    Physical Exam Vitals and nursing note reviewed.  Constitutional:      Appearance: He is well-developed.  HENT:     Head: Normocephalic and atraumatic.  Cardiovascular:     Rate and Rhythm: Normal rate and regular rhythm.     Heart sounds: Normal heart sounds. No murmur heard.    No friction rub. No gallop.  Pulmonary:     Effort: Pulmonary effort is normal. No tachypnea or respiratory distress.     Breath sounds: Normal breath sounds. No decreased breath sounds, wheezing, rhonchi or rales.  Chest:     Chest wall: No tenderness.  Abdominal:     General: Bowel sounds are normal.     Palpations: Abdomen is soft.  Musculoskeletal:        General: Normal range of motion.     Cervical back: Normal range of motion.  Skin:    General:  Skin is warm and dry.  Neurological:     Mental Status: He is alert and oriented to person, place, and time.     Coordination: Coordination normal.  Psychiatric:        Behavior: Behavior normal. Behavior is cooperative.        Thought Content: Thought content normal.        Judgment: Judgment normal.          Patient has been counseled extensively about nutrition and exercise as well as the importance of adherence with medications and regular  follow-up. The patient was given clear instructions to go to ER or return to medical center if symptoms don't improve, worsen or new problems develop. The patient verbalized understanding.   Follow-up: Return for lab appt after july 28th. See me in 2 weeks for tele visit on a tuesday for BP Check. Gildardo Pounds, FNP-BC [facility] [place], [place]   05/07/2022, 4:25 PM

## 2022-05-20 ENCOUNTER — Ambulatory Visit (HOSPITAL_BASED_OUTPATIENT_CLINIC_OR_DEPARTMENT_OTHER): Payer: Medicare Other | Admitting: Nurse Practitioner

## 2022-05-20 ENCOUNTER — Other Ambulatory Visit: Payer: Self-pay

## 2022-05-20 ENCOUNTER — Encounter: Payer: Self-pay | Admitting: Nurse Practitioner

## 2022-05-20 DIAGNOSIS — I10 Essential (primary) hypertension: Secondary | ICD-10-CM | POA: Diagnosis not present

## 2022-05-20 MED ORDER — AMLODIPINE BESYLATE 5 MG PO TABS
5.0000 mg | ORAL_TABLET | Freq: Every day | ORAL | 1 refills | Status: DC
  Filled 2022-05-20: qty 30, 30d supply, fill #0
  Filled 2022-06-13: qty 30, 30d supply, fill #1
  Filled 2022-08-05: qty 30, 30d supply, fill #2
  Filled 2022-09-03: qty 30, 30d supply, fill #3

## 2022-05-20 NOTE — Progress Notes (Signed)
Virtual Visit via Telephone Note  I discussed the limitations, risks, security and privacy concerns of performing an evaluation and management service by telephone and the availability of in person appointments. I also discussed with the patient that there may be a patient responsible charge related to this service. The patient expressed understanding and agreed to proceed.    I connected with Ronald Massey on 05/20/22  at   2:10 PM EDT  EDT by telephone and verified that I am speaking with the correct person using two identifiers.  Location of Patient: Private Residence   Location of Provider: [facility]    Persons participating in Telemedicine visit: Bertram Denver FNP-BC Ronald Massey    History of Present Illness: Telemedicine visit for: BP check  He has a past medical history of Dental cavities (12/25/2014), DM2, Hyperlipidemia, Hypertension, Onychomycosis of toenail (03/04/2016), Pollen allergies, and Tinea pedis of both feet (03/04/2016).   He is currently taking chlorthalidone 25 mg daily. Had not been taking carvedilol 6.25 mg BID. Average blood pressure over the past few weeks: 133/81 Will add low dose amlodipine today  126/79 148/91 152/99 143/87 121/79 Average: 138/87  138/81 139/83 137/82 125/75 128/76 Average: 133/79  133/78 127/75 135/90 137/74 134/77 Average: 133/78  Past Medical History:  Diagnosis Date   Dental cavities 12/25/2014   Diabetes mellitus without complication (HCC)    Hyperlipidemia    Hypertension    Onychomycosis of toenail 03/04/2016   Pollen allergies    Tinea pedis of both feet 03/04/2016    Past Surgical History:  Procedure Laterality Date   EYE SURGERY      Family History  Problem Relation Age of Onset   Hypertension Mother     Social History   Socioeconomic History   Marital status: Married    Spouse name: Not on file   Number of children: Not on file   Years of education: Not on file    Highest education level: Not on file  Occupational History   Not on file  Tobacco Use   Smoking status: Never   Smokeless tobacco: Never  Vaping Use   Vaping Use: Never used  Substance and Sexual Activity   Alcohol use: No   Drug use: No   Sexual activity: Not on file  Other Topics Concern   Not on file  Social History Narrative   Not on file   Social Determinants of Health   Financial Resource Strain: Not on file  Food Insecurity: Not on file  Transportation Needs: Not on file  Physical Activity: Not on file  Stress: Not on file  Social Connections: Not on file     Observations/Objective: Awake, alert and oriented x 3   Review of Systems  Constitutional:  Negative for fever, malaise/fatigue and weight loss.  HENT: Negative.  Negative for nosebleeds.   Eyes: Negative.  Negative for blurred vision, double vision and photophobia.  Respiratory: Negative.  Negative for cough and shortness of breath.   Cardiovascular: Negative.  Negative for chest pain, palpitations and leg swelling.  Gastrointestinal: Negative.  Negative for heartburn, nausea and vomiting.  Musculoskeletal: Negative.  Negative for myalgias.  Neurological: Negative.  Negative for dizziness, focal weakness, seizures and headaches.  Psychiatric/Behavioral: Negative.  Negative for suicidal ideas.     Assessment and Plan: Diagnoses and all orders for this visit:  Essential hypertension -     amLODipine (NORVASC) 5 MG tablet; Take 1 tablet (5 mg total) by mouth daily.  FOR BLOOD PRESSURE Continue chlorthalidone as prescribed. Reminded to bring in blood pressure log for follow  up appointment.  RECOMMENDATIONS: DASH/Mediterranean Diets are healthier choices for HTN.      Follow Up Instructions Return in about 1 week (around 05/27/2022) for BP recheck.     I discussed the assessment and treatment plan with the patient. The patient was provided an opportunity to ask questions and all were answered. The  patient agreed with the plan and demonstrated an understanding of the instructions.   The patient was advised to call back or seek an in-person evaluation if the symptoms worsen or if the condition fails to improve as anticipated.  I provided 12 minutes of non-face-to-face time during this encounter including median intraservice time, reviewing previous notes, labs, imaging, medications and explaining diagnosis and management.  Claiborne Rigg, FNP-BC

## 2022-05-22 ENCOUNTER — Other Ambulatory Visit: Payer: Self-pay

## 2022-05-27 ENCOUNTER — Encounter: Payer: Self-pay | Admitting: Nurse Practitioner

## 2022-05-27 ENCOUNTER — Ambulatory Visit (HOSPITAL_BASED_OUTPATIENT_CLINIC_OR_DEPARTMENT_OTHER): Payer: Medicare Other | Admitting: Nurse Practitioner

## 2022-05-27 DIAGNOSIS — I10 Essential (primary) hypertension: Secondary | ICD-10-CM | POA: Diagnosis not present

## 2022-05-27 NOTE — Progress Notes (Signed)
Virtual Visit via Telephone Note  I discussed the limitations, risks, security and privacy concerns of performing an evaluation and management service by telephone and the availability of in person appointments. I also discussed with the patient that there may be a patient responsible charge related to this service. The patient expressed understanding and agreed to proceed.    I connected with Ronald Massey on 05/27/22  at   2:30 PM EDT  EDT by telephone and verified that I am speaking with the correct person using two identifiers.  Location of Patient: Private Residence   [facility] of Provider: [facility]    Persons participating in Telemedicine visit: Bertram Denver FNP-BC Ronald Massey    History of Present Illness: Telemedicine visit for: HTN  Ronald Massey was started on amlodipine 1 week ago due to blood pressure not being at goal of <130/80. At that time he was instructed to continue his chlorthalidone 25 mg daily as well.   Today he reports the following readings:  122/84 121/68 130/78 122/77 127/75  Average BP is at goal (124/76) and at this time we will continue on amlodipine 5 mg and chlorthalidone 25 mg daily.     Past Medical History:  Diagnosis Date   Dental cavities 12/25/2014   Diabetes mellitus without complication (HCC)    Hyperlipidemia    Hypertension    Onychomycosis of toenail 03/04/2016   Pollen allergies    Tinea pedis of both feet 03/04/2016    Past Surgical History:  Procedure Laterality Date   EYE SURGERY      Family History  Problem Relation Age of Onset   Hypertension Mother     Social History   Socioeconomic History   Marital status: Married    Spouse name: Not on file   Number of children: Not on file   Years of education: Not on file   Highest education level: Not on file  Occupational History   Not on file  Tobacco Use   Smoking status: Never   Smokeless tobacco: Never  Vaping Use   Vaping Use:  Never used  Substance and Sexual Activity   Alcohol use: No   Drug use: No   Sexual activity: Not on file  Other Topics Concern   Not on file  Social History Narrative   Not on file   Social Determinants of Health   Financial Resource Strain: Not on file  Food Insecurity: Not on file  Transportation Needs: Not on file  Physical Activity: Not on file  Stress: Not on file  Social Connections: Not on file     Observations/Objective: Awake, alert and oriented x 3   Review of Systems  Constitutional:  Negative for fever, malaise/fatigue and weight loss.  HENT: Negative.  Negative for nosebleeds.   Eyes: Negative.  Negative for blurred vision, double vision and photophobia.  Respiratory: Negative.  Negative for cough and shortness of breath.   Cardiovascular: Negative.  Negative for chest pain, palpitations and leg swelling.  Gastrointestinal: Negative.  Negative for heartburn, nausea and vomiting.  Musculoskeletal: Negative.  Negative for myalgias.  Neurological: Negative.  Negative for dizziness, focal weakness, seizures and headaches.  Psychiatric/Behavioral: Negative.  Negative for suicidal ideas.     Assessment and Plan: Diagnoses and all orders for this visit:  Essential hypertension Continue amlodipine and chlorthalidone as prescribed.  Reminded to bring in blood pressure log for follow  up appointment.  RECOMMENDATIONS: DASH/Mediterranean Diets are healthier choices for HTN.  Follow Up Instructions Return in about 3 months (around 08/27/2022).     I discussed the assessment and treatment plan with the patient. The patient was provided an opportunity to ask questions and all were answered. The patient agreed with the plan and demonstrated an understanding of the instructions.   The patient was advised to call back or seek an in-person evaluation if the symptoms worsen or if the condition fails to improve as anticipated.  I provided 11 minutes of  non-face-to-face time during this encounter including median intraservice time, reviewing previous notes, labs, imaging, medications and explaining diagnosis and management.  Claiborne Rigg, FNP-BC

## 2022-06-02 ENCOUNTER — Ambulatory Visit: Payer: Medicare Other | Attending: Nurse Practitioner

## 2022-06-02 ENCOUNTER — Other Ambulatory Visit: Payer: Medicare Other

## 2022-06-02 DIAGNOSIS — I10 Essential (primary) hypertension: Secondary | ICD-10-CM

## 2022-06-03 ENCOUNTER — Other Ambulatory Visit: Payer: Self-pay | Admitting: Nurse Practitioner

## 2022-06-03 DIAGNOSIS — E876 Hypokalemia: Secondary | ICD-10-CM

## 2022-06-03 LAB — CBC WITH DIFFERENTIAL/PLATELET
Basophils Absolute: 0 10*3/uL (ref 0.0–0.2)
Basos: 0 %
EOS (ABSOLUTE): 0.1 10*3/uL (ref 0.0–0.4)
Eos: 2 %
Hematocrit: 42.5 % (ref 37.5–51.0)
Hemoglobin: 14.7 g/dL (ref 13.0–17.7)
Immature Grans (Abs): 0 10*3/uL (ref 0.0–0.1)
Immature Granulocytes: 0 %
Lymphocytes Absolute: 2.4 10*3/uL (ref 0.7–3.1)
Lymphs: 47 %
MCH: 31.1 pg (ref 26.6–33.0)
MCHC: 34.6 g/dL (ref 31.5–35.7)
MCV: 90 fL (ref 79–97)
Monocytes Absolute: 0.4 10*3/uL (ref 0.1–0.9)
Monocytes: 8 %
Neutrophils Absolute: 2.2 10*3/uL (ref 1.4–7.0)
Neutrophils: 43 %
Platelets: 197 10*3/uL (ref 150–450)
RBC: 4.73 x10E6/uL (ref 4.14–5.80)
RDW: 13 % (ref 11.6–15.4)
WBC: 5.2 10*3/uL (ref 3.4–10.8)

## 2022-06-03 LAB — BASIC METABOLIC PANEL
BUN/Creatinine Ratio: 21 (ref 10–24)
BUN: 19 mg/dL (ref 8–27)
CO2: 24 mmol/L (ref 20–29)
Calcium: 9.5 mg/dL (ref 8.6–10.2)
Chloride: 100 mmol/L (ref 96–106)
Creatinine, Ser: 0.91 mg/dL (ref 0.76–1.27)
Glucose: 147 mg/dL — ABNORMAL HIGH (ref 70–99)
Potassium: 3.3 mmol/L — ABNORMAL LOW (ref 3.5–5.2)
Sodium: 139 mmol/L (ref 134–144)
eGFR: 94 mL/min/{1.73_m2} (ref >59)

## 2022-06-04 ENCOUNTER — Telehealth: Payer: Self-pay

## 2022-06-04 ENCOUNTER — Other Ambulatory Visit: Payer: Self-pay

## 2022-06-04 NOTE — Telephone Encounter (Signed)
Pt given lab results per notes of Zelda, NP on 06/04/22. Pt verbalized understanding and scheduled appt for 06/16/22 at 1100 for repeat K+ lab.

## 2022-06-05 ENCOUNTER — Other Ambulatory Visit: Payer: Self-pay

## 2022-06-06 ENCOUNTER — Other Ambulatory Visit: Payer: Self-pay

## 2022-06-11 ENCOUNTER — Other Ambulatory Visit: Payer: Self-pay

## 2022-06-13 ENCOUNTER — Ambulatory Visit: Payer: Medicare Other | Attending: Nurse Practitioner

## 2022-06-13 ENCOUNTER — Other Ambulatory Visit: Payer: Self-pay

## 2022-06-13 DIAGNOSIS — E876 Hypokalemia: Secondary | ICD-10-CM

## 2022-06-14 LAB — POTASSIUM: Potassium: 4 mmol/L (ref 3.5–5.2)

## 2022-06-16 ENCOUNTER — Other Ambulatory Visit: Payer: Medicare Other

## 2022-07-02 ENCOUNTER — Other Ambulatory Visit: Payer: Self-pay

## 2022-07-02 MED ORDER — LATANOPROST 0.005 % OP SOLN
1.0000 [drp] | Freq: Every evening | OPHTHALMIC | 4 refills | Status: DC
Start: 2022-07-01 — End: 2023-02-16
  Filled 2022-07-02: qty 7.5, 60d supply, fill #0

## 2022-07-03 NOTE — Progress Notes (Signed)
Ronald Massey Ronald Massey [address] [place] [address] Phone: [phone]   Assessment and Plan:     1. Chronic pain of left knee 2. Primary osteoarthritis of left knee -Chronic with exacerbation, initial sports Massey visit - 2 weeks of left knee pain consistent with a flare of osteoarthritis based on HPI, physical exam, x-ray imaging - Patient elected for CSI of left knee.  CSI may temporarily elevate patient's blood glucose with past medical history of DM type II.  Tolerated well per note below - Start HEP for knee - X-ray obtained in clinic.  My interpretation: No acute fracture or dislocation.  Mildly decreased joint space in the medial and patellofemoral compartments with mild cortical changes  Procedure: Knee Joint Injection Side: Left Indication: Flare of osteoarthritis  Risks explained and consent was given verbally. The site was cleaned with alcohol prep. A needle was introduced with an anterio-lateral approach. Injection given using 60mL of 1% lidocaine without epinephrine and 38mL of kenalog 40mg /ml. This was well tolerated and resulted in symptomatic relief.  Needle was removed, hemostasis achieved, and post injection instructions were explained.   Pt was advised to call or return to clinic if these symptoms worsen or fail to improve as anticipated.   Pertinent previous records reviewed include none  Follow Up: 3 to 4 weeks for reevaluation.  Could consider physical therapy versus HA injection versus course of NSAIDs if no improvement or worsening of symptoms   Subjective:   I, Ronald Massey, am serving as a for Doctor Neurosurgeon  Chief Complaint: left knee pain   HPI:   07/04/2022 Patient is a 66 year old male complaining of left knee pain. Patient states that he is becoming , he is muslim so when he prays it hurts or when he force flexes , hasnt been hurting for that long   Relevant Historical Information:  DM type II, hypertension  Additional pertinent review of systems negative.   Current Outpatient Medications:    amLODipine (NORVASC) 5 MG tablet, Take 1 tablet (5 mg total) by mouth daily. FOR BLOOD PRESSURE, Disp: 90 tablet, Rfl: 1   atorvastatin (LIPITOR) 20 MG tablet, Take 1 tablet (20 mg total) by mouth daily., Disp: 90 tablet, Rfl: 0   Blood Glucose Monitoring Suppl (CONTOUR NEXT ONE) DEVI, Check blood sugar twice daily. Dx E11.8, Disp: 1 each, Rfl: 0   celecoxib (CELEBREX) 200 MG capsule, Take by mouth., Disp: , Rfl:    chlorthalidone (HYGROTON) 25 MG tablet, Take 1 tablet (25 mg total) by mouth daily., Disp: 90 tablet, Rfl: 1   Dulaglutide (TRULICITY) 0.75 MG/0.5ML SOPN, Inject 0.75 mg into the skin once a week., Disp: 6 mL, Rfl: 0   glucose blood (CONTOUR NEXT TEST) test strip, Check blood sugar twice daily. Dx E11.8, Disp: 100 each, Rfl: 12   latanoprost (XALATAN) 0.005 % ophthalmic solution, Instill 1 drop into both eyes every evening as directed., Disp: 7.5 mL, Rfl: 4   metFORMIN (GLUCOPHAGE) 500 MG tablet, Take 1 tablet (500 mg total) by mouth 2 (two) times daily with a meal., Disp: 180 tablet, Rfl: 1   Microlet Lancets MISC, Check blood sugar twice daily. Dx E11.8, Disp: 100 each, Rfl: 2   Travoprost, BAK Free, (TRAVATAN Z) 0.004 % SOLN ophthalmic solution, PLACE 1 DROP INTO BOTH EYES AT BEDTIME., Disp: 2.5 mL, Rfl: 1   Travoprost, BAK Free, (TRAVATAN) 0.004 % SOLN ophthalmic solution, Instill 1 drop into both eye  every night as directed., Disp: 2.5 mL, Rfl: 3   triamcinolone ointment (KENALOG) 0.1 %, Apply topically 2 (two) times daily., Disp: 60 g, Rfl: 3   Objective:     Vitals:   07/04/22 1517  Pulse: 76  SpO2: 99%  Weight: 169 lb (76.7 kg)  Height: 5\' 8"  (1.727 m)      Body mass index is 25.7 kg/m.    Physical Exam:    General:  awake, alert oriented, no acute distress nontoxic Skin: no suspicious lesions or rashes Neuro:sensation intact, no deficits, strength  5/5 with no deficits, no atrophy, normal muscle tone Psych: No signs of anxiety, depression or other mood disorder  Knee: No swelling No deformity Neg fluid wave, joint milking ROM Flex 110 , Ext 0  TTP medial and lateral femoral condyle NTTP over the quad tendon, patella, plica, patella tendon, tibial tuberostiy, fibular head, posterior fossa, pes anserine bursa, gerdy's tubercle, medial jt line, lateral jt line Neg anterior and posterior drawer Neg lachman Neg sag sign Negative varus stress Negative valgus stress Negative McMurray Negative Thessaly  Gait normal    Electronically signed by:  D.Ronald Massey Sports Massey 4:02 PM 07/04/22

## 2022-07-04 ENCOUNTER — Ambulatory Visit (INDEPENDENT_AMBULATORY_CARE_PROVIDER_SITE_OTHER): Payer: Medicare Other | Admitting: Sports Medicine

## 2022-07-04 ENCOUNTER — Ambulatory Visit (INDEPENDENT_AMBULATORY_CARE_PROVIDER_SITE_OTHER): Payer: Medicare Other

## 2022-07-04 VITALS — HR 76 | Ht 68.0 in | Wt 169.0 lb

## 2022-07-04 DIAGNOSIS — M25562 Pain in left knee: Secondary | ICD-10-CM

## 2022-07-04 DIAGNOSIS — M1712 Unilateral primary osteoarthritis, left knee: Secondary | ICD-10-CM

## 2022-07-04 DIAGNOSIS — G8929 Other chronic pain: Secondary | ICD-10-CM | POA: Diagnosis not present

## 2022-07-04 NOTE — Patient Instructions (Addendum)
Good to see you Knee HEP 3-4 week follow up   

## 2022-07-09 ENCOUNTER — Other Ambulatory Visit: Payer: Self-pay

## 2022-07-09 DIAGNOSIS — Z961 Presence of intraocular lens: Secondary | ICD-10-CM | POA: Diagnosis not present

## 2022-07-09 DIAGNOSIS — E119 Type 2 diabetes mellitus without complications: Secondary | ICD-10-CM | POA: Diagnosis not present

## 2022-07-09 DIAGNOSIS — H401131 Primary open-angle glaucoma, bilateral, mild stage: Secondary | ICD-10-CM | POA: Diagnosis not present

## 2022-07-09 MED ORDER — TRAVOPROST (BAK FREE) 0.004 % OP SOLN
OPHTHALMIC | 3 refills | Status: DC
  Filled 2022-07-09: qty 2.5, 20d supply, fill #0
  Filled 2022-08-05: qty 2.5, 25d supply, fill #1
  Filled 2022-08-19: qty 2.5, 20d supply, fill #1
  Filled 2022-12-08: qty 2.5, 25d supply, fill #2
  Filled 2023-02-02: qty 2.5, 20d supply, fill #2
  Filled 2023-02-10: qty 2.5, 25d supply, fill #2

## 2022-07-10 ENCOUNTER — Other Ambulatory Visit: Payer: Self-pay

## 2022-07-18 ENCOUNTER — Other Ambulatory Visit: Payer: Self-pay

## 2022-07-18 NOTE — Progress Notes (Deleted)
    Ronald Massey D.[facility] [address] [place] [address] Phone: [phone]   Assessment and Plan:     There are no diagnoses linked to this encounter.  ***   Pertinent previous records reviewed include ***   Follow Up: ***     Subjective:   I, Eliz Nigg, am serving as a Neurosurgeon for Doctor Richardean Sale   Chief Complaint: left knee pain    HPI:    07/04/2022 Patient is a 66 year old male complaining of left knee pain. Patient states that he is becoming , he is muslim so when he prays it hurts or when he force flexes , hasnt been hurting for that long   07/31/2022 Patient states  Relevant Historical Information: DM type II, hypertension  Additional pertinent review of systems negative.   Current Outpatient Medications:    amLODipine (NORVASC) 5 MG tablet, Take 1 tablet (5 mg total) by mouth daily. FOR BLOOD PRESSURE, Disp: 90 tablet, Rfl: 1   atorvastatin (LIPITOR) 20 MG tablet, Take 1 tablet (20 mg total) by mouth daily., Disp: 90 tablet, Rfl: 0   Blood Glucose Monitoring Suppl (CONTOUR NEXT ONE) DEVI, Check blood sugar twice daily. Dx E11.8, Disp: 1 each, Rfl: 0   celecoxib (CELEBREX) 200 MG capsule, Take by mouth., Disp: , Rfl:    chlorthalidone (HYGROTON) 25 MG tablet, Take 1 tablet (25 mg total) by mouth daily., Disp: 90 tablet, Rfl: 1   Dulaglutide (TRULICITY) 0.75 MG/0.5ML SOPN, Inject 0.75 mg into the skin once a week., Disp: 6 mL, Rfl: 0   glucose blood (CONTOUR NEXT TEST) test strip, Check blood sugar twice daily. Dx E11.8, Disp: 100 each, Rfl: 12   latanoprost (XALATAN) 0.005 % ophthalmic solution, Instill 1 drop into both eyes every evening as directed., Disp: 7.5 mL, Rfl: 4   metFORMIN (GLUCOPHAGE) 500 MG tablet, Take 1 tablet (500 mg total) by mouth 2 (two) times daily with a meal., Disp: 180 tablet, Rfl: 1   Microlet Lancets MISC, Check blood sugar twice daily. Dx E11.8, Disp: 100 each, Rfl: 2   Travoprost,  BAK Free, (TRAVATAN Z) 0.004 % SOLN ophthalmic solution, PLACE 1 DROP INTO BOTH EYES AT BEDTIME., Disp: 2.5 mL, Rfl: 1   Travoprost, BAK Free, (TRAVATAN Z) 0.004 % SOLN ophthalmic solution, Instill 1 drop into both eyes every evening as directed, Disp: 2.5 mL, Rfl: 3   Travoprost, BAK Free, (TRAVATAN) 0.004 % SOLN ophthalmic solution, Instill 1 drop into both eye every night as directed., Disp: 2.5 mL, Rfl: 3   triamcinolone ointment (KENALOG) 0.1 %, Apply topically 2 (two) times daily., Disp: 60 g, Rfl: 3   Objective:     There were no vitals filed for this visit.    There is no height or weight on file to calculate BMI.    Physical Exam:    ***   Electronically signed by:  Ronald Massey D.[facility] 8:24 AM 07/18/22

## 2022-07-31 ENCOUNTER — Ambulatory Visit: Payer: Medicare Other | Admitting: Sports Medicine

## 2022-08-05 ENCOUNTER — Other Ambulatory Visit: Payer: Self-pay

## 2022-08-05 ENCOUNTER — Other Ambulatory Visit: Payer: Self-pay | Admitting: Nurse Practitioner

## 2022-08-05 DIAGNOSIS — E1169 Type 2 diabetes mellitus with other specified complication: Secondary | ICD-10-CM

## 2022-08-05 DIAGNOSIS — E118 Type 2 diabetes mellitus with unspecified complications: Secondary | ICD-10-CM

## 2022-08-05 MED ORDER — ATORVASTATIN CALCIUM 20 MG PO TABS
20.0000 mg | ORAL_TABLET | Freq: Every day | ORAL | 0 refills | Status: DC
  Filled 2022-08-05 – 2022-08-19 (×2): qty 90, 90d supply, fill #0

## 2022-08-05 MED ORDER — TRULICITY 0.75 MG/0.5ML ~~LOC~~ SOAJ
0.7500 mg | SUBCUTANEOUS | 0 refills | Status: DC
  Filled 2022-08-05 – 2022-08-19 (×2): qty 6, 84d supply, fill #0

## 2022-08-05 NOTE — Telephone Encounter (Signed)
Requested Prescriptions  Pending Prescriptions Disp Refills  . atorvastatin (LIPITOR) 20 MG tablet 90 tablet 0    Sig: Take 1 tablet (20 mg total) by mouth daily.     Cardiovascular:  Antilipid - Statins Failed - 08/05/2022  8:28 AM      Failed - Lipid Panel in normal range within the last 12 months    Cholesterol, Total  Date Value Ref Range Status  05/24/2021 209 (H) 100 - 199 mg/dL Final   Cholesterol  Date Value Ref Range Status  02/27/2022 137 0 - 200 mg/dL Final    Comment:    ATP III Classification       Desirable:  < 200 mg/dL               Borderline High:  200 - 239 mg/dL          High:  > = 412 mg/dL   LDL Chol Calc (NIH)  Date Value Ref Range Status  05/24/2021 141 (H) 0 - 99 mg/dL Final   LDL Cholesterol  Date Value Ref Range Status  02/27/2022 73 0 - 99 mg/dL Final   HDL  Date Value Ref Range Status  02/27/2022 38.30 (L) >39.00 mg/dL Final  87/86/7672 47 >09 mg/dL Final   Triglycerides  Date Value Ref Range Status  02/27/2022 130.0 0.0 - 149.0 mg/dL Final    Comment:    Normal:  <150 mg/dLBorderline High:  150 - 199 mg/dL         Passed - Patient is not pregnant      Passed - Valid encounter within last 12 months    Recent Outpatient Visits          2 months ago Essential hypertension   [facility] [place], Shea Stakes, NP   2 months ago Essential hypertension   [facility] [place], Shea Stakes, NP   3 months ago Essential hypertension   [facility] [place], Shea Stakes, NP   1 year ago Hyperlipidemia associated with type 2 diabetes mellitus [facility])   [facility] Bing Neighbors, FNP   2 years ago Type 2 diabetes mellitus with complication, without long-term current use of insulin [facility])   [facility] [place], Shea Stakes, NP      Future Appointments            In 1 month Claiborne Rigg, NP [facility]           . Dulaglutide (TRULICITY) 0.75 MG/0.5ML SOPN 6 mL 0    Sig: Inject 0.75 mg into the skin once a week.     Endocrinology:  Diabetes - GLP-1 Receptor Agonists Failed - 08/05/2022  8:28 AM      Failed - HBA1C is between 0 and 7.9 and within 180 days    HbA1c, POC (controlled diabetic range)  Date Value Ref Range Status  05/07/2022 8.6 (A) 0.0 - 7.0 % Final         Passed - Valid encounter within last 6 months    Recent Outpatient Visits          2 months ago Essential hypertension   [facility] [place], Shea Stakes, NP   2 months ago Essential hypertension   [facility] [place], Shea Stakes, NP   3 months ago Essential hypertension  [place], [place] W, NP   1 year ago Hyperlipidemia associated with type 2 diabetes mellitus [facility])   [place] Jun, FNP   2 years ago Type 2 diabetes mellitus with complication, without long-term current use of insulin [facility])   [place] Gildardo Pounds, NP      Future Appointments            In 1 month Gildardo Pounds, NP [place]

## 2022-08-12 ENCOUNTER — Other Ambulatory Visit: Payer: Self-pay

## 2022-08-14 ENCOUNTER — Other Ambulatory Visit: Payer: Self-pay

## 2022-08-14 MED ORDER — LUMIGAN 0.01 % OP SOLN
1.0000 [drp] | Freq: Every evening | OPHTHALMIC | 3 refills | Status: DC
Start: 2022-08-14 — End: 2023-02-16
  Filled 2022-08-14: qty 7.5, 75d supply, fill #0

## 2022-08-15 ENCOUNTER — Other Ambulatory Visit: Payer: Self-pay

## 2022-08-19 ENCOUNTER — Other Ambulatory Visit: Payer: Self-pay

## 2022-09-02 ENCOUNTER — Other Ambulatory Visit: Payer: Self-pay

## 2022-09-02 MED ORDER — VYZULTA 0.024 % OP SOLN
1.0000 [drp] | Freq: Every evening | OPHTHALMIC | 3 refills | Status: DC
Start: 2022-09-02 — End: 2023-02-16
  Filled 2022-09-02 – 2022-09-09 (×2): qty 2.5, 25d supply, fill #0
  Filled 2022-10-06: qty 2.5, 25d supply, fill #1
  Filled 2022-10-31: qty 2.5, 25d supply, fill #2
  Filled 2022-12-08: qty 2.5, 25d supply, fill #3

## 2022-09-03 ENCOUNTER — Other Ambulatory Visit: Payer: Self-pay

## 2022-09-09 ENCOUNTER — Other Ambulatory Visit: Payer: Self-pay

## 2022-09-09 ENCOUNTER — Encounter: Payer: Self-pay | Admitting: Nurse Practitioner

## 2022-09-09 ENCOUNTER — Ambulatory Visit: Payer: Medicare Other | Attending: Nurse Practitioner | Admitting: Nurse Practitioner

## 2022-09-09 VITALS — BP 122/84 | Ht 68.0 in | Wt 169.0 lb

## 2022-09-09 DIAGNOSIS — E118 Type 2 diabetes mellitus with unspecified complications: Secondary | ICD-10-CM

## 2022-09-09 DIAGNOSIS — E1169 Type 2 diabetes mellitus with other specified complication: Secondary | ICD-10-CM | POA: Diagnosis not present

## 2022-09-09 DIAGNOSIS — I10 Essential (primary) hypertension: Secondary | ICD-10-CM | POA: Diagnosis not present

## 2022-09-09 DIAGNOSIS — E785 Hyperlipidemia, unspecified: Secondary | ICD-10-CM | POA: Diagnosis not present

## 2022-09-09 DIAGNOSIS — Z23 Encounter for immunization: Secondary | ICD-10-CM

## 2022-09-09 DIAGNOSIS — E7800 Pure hypercholesterolemia, unspecified: Secondary | ICD-10-CM

## 2022-09-09 LAB — POCT GLYCOSYLATED HEMOGLOBIN (HGB A1C): HbA1c, POC (controlled diabetic range): 7 % (ref 0.0–7.0)

## 2022-09-09 MED ORDER — METFORMIN HCL 500 MG PO TABS
500.0000 mg | ORAL_TABLET | Freq: Two times a day (BID) | ORAL | 1 refills | Status: DC
  Filled 2022-09-09 – 2022-10-06 (×2): qty 180, 90d supply, fill #0
  Filled 2023-02-02: qty 180, 90d supply, fill #1

## 2022-09-09 MED ORDER — AMLODIPINE BESYLATE 5 MG PO TABS
5.0000 mg | ORAL_TABLET | Freq: Every day | ORAL | 1 refills | Status: DC
  Filled 2022-09-09 – 2022-10-06 (×2): qty 90, 90d supply, fill #0
  Filled 2023-02-02: qty 90, 90d supply, fill #1

## 2022-09-09 MED ORDER — CHLORTHALIDONE 25 MG PO TABS
25.0000 mg | ORAL_TABLET | Freq: Every day | ORAL | 1 refills | Status: DC
  Filled 2022-09-09 – 2022-10-06 (×2): qty 90, 90d supply, fill #0
  Filled 2023-02-02: qty 90, 90d supply, fill #1

## 2022-09-09 MED ORDER — ATORVASTATIN CALCIUM 20 MG PO TABS
20.0000 mg | ORAL_TABLET | Freq: Every day | ORAL | 1 refills | Status: DC
  Filled 2022-09-09 – 2022-12-08 (×2): qty 90, 90d supply, fill #0
  Filled 2023-03-11: qty 90, 90d supply, fill #1

## 2022-09-09 MED ORDER — TRULICITY 0.75 MG/0.5ML ~~LOC~~ SOAJ
0.7500 mg | SUBCUTANEOUS | 1 refills | Status: DC
  Filled 2022-09-09 – 2022-12-30 (×2): qty 6, 84d supply, fill #0

## 2022-09-09 NOTE — Progress Notes (Signed)
Assessment & Plan:  Ronald Massey was seen today for diabetes and hypertension.  Diagnoses and all orders for this visit:  Type 2 diabetes mellitus with complication, without long-term current use of insulin (HCC) -     POCT glycosylated hemoglobin (Hb A1C) -     CMP14+EGFR -     Dulaglutide (TRULICITY) 6.22 WL/7.9GX SOPN; Inject 0.75 mg into the skin once a week. Continue blood sugar control as discussed in office today, low carbohydrate diet, and regular physical exercise as tolerated, 150 minutes per week (30 min each day, 5 days per week, or 50 min 3 days per week). Keep blood sugar logs with fasting goal of 90-130 mg/dl, post prandial (after you eat) less than 180.  For Hypoglycemia: BS <60 and Hyperglycemia BS >400; contact the clinic ASAP. Annual eye exams and foot exams are recommended.   Primary hypertension -     amLODipine (NORVASC) 5 MG tablet; Take 1 tablet (5 mg total) by mouth daily. FOR BLOOD PRESSURE -     chlorthalidone (HYGROTON) 25 MG tablet; Take 1 tablet (25 mg total) by mouth daily. Continue all antihypertensives as prescribed.  Reminded to bring in blood pressure log for follow  up appointment.  RECOMMENDATIONS: DASH/Mediterranean Diets are healthier choices for HTN.    Hyperlipidemia associated with type 2 diabetes mellitus (HCC) -     Lipid panel -     atorvastatin (LIPITOR) 20 MG tablet; Take 1 tablet (20 mg total) by mouth daily. -     metFORMIN (GLUCOPHAGE) 500 MG tablet; Take 1 tablet (500 mg total) by mouth 2 (two) times daily with a meal. INSTRUCTIONS: Work on a low fat, heart healthy diet and participate in regular aerobic exercise program by working out at least 150 minutes per week; 5 days a week-30 minutes per day. Avoid red meat/beef/steak,  fried foods. junk foods, sodas, sugary drinks, unhealthy snacking, alcohol and smoking.  Drink at least 80 oz of water per day and monitor your carbohydrate intake daily.    Need for immunization against influenza -      Flu Vaccine QUAD 40moIM (Fluarix, Fluzone & Alfiuria Quad PF)    Patient has been counseled on age-appropriate routine health concerns for screening and prevention. These are reviewed and up-to-date. Referrals have been placed accordingly. Immunizations are up-to-date or declined.    Subjective:   Chief Complaint  Patient presents with   Diabetes   Hypertension   Diabetes Pertinent negatives for hypoglycemia include no dizziness, headaches or seizures. Pertinent negatives for diabetes include no blurred vision, no chest pain and no weight loss.  Hypertension Pertinent negatives include no blurred vision, chest pain, headaches, malaise/fatigue, palpitations or shortness of breath.   Ronald Massey presents to office today for follow up to HTN and DM  He has a past medical history of Dental cavities (12/25/2014), DM 2, Hyperlipidemia, Hypertension, Onychomycosis of toenail (03/04/2016), Pollen allergies, and Tinea pedis of both feet (03/04/2016).    HTN Blood pressure is well controlled.  Reports normal blood pressure readings at home. He is only taking chlorthalidone 25 mg daily at this time.  States he has not been taking carvedilol 6.25 times daily  BP Readings from Last 3 Encounters:  09/09/22 122/84  05/07/22 116/76  02/27/22 (!) 150/82     DM 2 Diabetes is not at goal.  He endorses dietary nonadherence as well as medication nonadherence.  Has only been taking metformin 500 mg once daily instead of twice daily  due to GI upset.  We will start low-dose Trulicity today with goal of A1c 6.0 or less.  LDL not quite at goal.  He does state he has not been taking his atorvastatin 20 mg daily as prescribed.  He is also not monitoring his blood glucose levels daily. Lab Results  Component Value Date   HGBA1C 7.0 09/09/2022    Lab Results  Component Value Date   HGBA1C 8.6 (A) 05/07/2022    Lab Results  Component Value Date   LDLCALC 97 09/09/2022     Review of Systems   Constitutional:  Negative for fever, malaise/fatigue and weight loss.  HENT: Negative.  Negative for nosebleeds.   Eyes: Negative.  Negative for blurred vision, double vision and photophobia.  Respiratory: Negative.  Negative for cough and shortness of breath.   Cardiovascular: Negative.  Negative for chest pain, palpitations and leg swelling.  Gastrointestinal: Negative.  Negative for heartburn, nausea and vomiting.  Musculoskeletal: Negative.  Negative for myalgias.  Neurological: Negative.  Negative for dizziness, focal weakness, seizures and headaches.  Psychiatric/Behavioral: Negative.  Negative for suicidal ideas.     Past Medical History:  Diagnosis Date   Dental cavities 12/25/2014   Diabetes mellitus without complication ([place])    Hyperlipidemia    Hypertension    Onychomycosis of toenail 03/04/2016   Pollen allergies    Tinea pedis of both feet 03/04/2016    Past Surgical History:  Procedure Laterality Date   EYE SURGERY      Family History  Problem Relation Age of Onset   Hypertension Mother     Social History Reviewed with no changes to be made today.   Outpatient Medications Prior to Visit  Medication Sig Dispense Refill   celecoxib (CELEBREX) 200 MG capsule Take by mouth.     latanoprost (XALATAN) 0.005 % ophthalmic solution Instill 1 drop into both eyes every evening as directed. 7.5 mL 4   Latanoprostene Bunod (VYZULTA) 0.024 % SOLN Place 1 drop into both eyes every evening. 2.5 mL 3   LUMIGAN 0.01 % SOLN Place 1 drop into both eyes every evening as directed. 7.5 mL 3   Microlet Lancets MISC Check blood sugar twice daily. Dx E11.8 100 each 2   Travoprost, BAK Free, (TRAVATAN Z) 0.004 % SOLN ophthalmic solution PLACE 1 DROP INTO BOTH EYES AT BEDTIME. 2.5 mL 1   Travoprost, BAK Free, (TRAVATAN Z) 0.004 % SOLN ophthalmic solution Instill 1 drop into both eyes every evening as directed 2.5 mL 3   Travoprost, BAK Free, (TRAVATAN) 0.004 % SOLN ophthalmic solution  Instill 1 drop into both eye every night as directed. 2.5 mL 3   triamcinolone ointment (KENALOG) 0.1 % Apply topically 2 (two) times daily. 60 g 3   amLODipine (NORVASC) 5 MG tablet Take 1 tablet (5 mg total) by mouth daily. FOR BLOOD PRESSURE 90 tablet 1   Blood Glucose Monitoring Suppl (CONTOUR NEXT ONE) DEVI Check blood sugar twice daily. Dx E11.8 1 each 0   chlorthalidone (HYGROTON) 25 MG tablet Take 1 tablet (25 mg total) by mouth daily. 90 tablet 1   Dulaglutide (TRULICITY) 3.76 EG/3.1DV SOPN Inject 0.75 mg into the skin once a week. 6 mL 0   glucose blood (CONTOUR NEXT TEST) test strip Check blood sugar twice daily. Dx E11.8 100 each 12   metFORMIN (GLUCOPHAGE) 500 MG tablet Take 1 tablet (500 mg total) by mouth 2 (two) times daily with a meal. 180 tablet 1   atorvastatin (LIPITOR)  20 MG tablet Take 1 tablet (20 mg total) by mouth daily. (Patient not taking: Reported on 09/09/2022) 90 tablet 0   No facility-administered medications prior to visit.    No Known Allergies     Objective:    BP 122/84   Ht _0  (1.727 m)   Wt 169 lb (76.7 kg)   BMI 25.70 kg/m  Wt Readings from Last 3 Encounters:  09/09/22 169 lb (76.7 kg)  07/04/22 169 lb (76.7 kg)  05/07/22 169 lb (76.7 kg)    Physical Exam Vitals and nursing note reviewed.  Constitutional:      Appearance: He is well-developed.  HENT:     Head: Normocephalic and atraumatic.  Cardiovascular:     Rate and Rhythm: Normal rate and regular rhythm.     Heart sounds: Normal heart sounds. No murmur heard.    No friction rub. No gallop.  Pulmonary:     Effort: Pulmonary effort is normal. No tachypnea or respiratory distress.     Breath sounds: Normal breath sounds. No decreased breath sounds, wheezing, rhonchi or rales.  Chest:     Chest wall: No tenderness.  Abdominal:     General: Bowel sounds are normal.     Palpations: Abdomen is soft.  Musculoskeletal:        General: Normal range of motion.     Cervical back: Normal  range of motion.  Skin:    General: Skin is warm and dry.  Neurological:     Mental Status: He is alert and oriented to person, place, and time.     Coordination: Coordination normal.  Psychiatric:        Behavior: Behavior normal. Behavior is cooperative.        Thought Content: Thought content normal.        Judgment: Judgment normal.          Patient has been counseled extensively about nutrition and exercise as well as the importance of adherence with medications and regular follow-up. The patient was given clear instructions to go to ER or return to medical center if symptoms don't improve, worsen or new problems develop. The patient verbalized understanding.   Follow-up: Return in about 3 months (around 12/10/2022).   Gildardo Pounds, FNP-BC [facility] [place], [place]   09/15/2022, 7:49 PM

## 2022-09-10 LAB — CMP14+EGFR
ALT: 29 IU/L (ref 0–44)
AST: 23 IU/L (ref 0–40)
Albumin/Globulin Ratio: 1.6 (ref 1.2–2.2)
Albumin: 4.7 g/dL (ref 3.9–4.9)
Alkaline Phosphatase: 75 IU/L (ref 44–121)
BUN/Creatinine Ratio: 14 (ref 10–24)
BUN: 16 mg/dL (ref 8–27)
Bilirubin Total: 0.6 mg/dL (ref 0.0–1.2)
CO2: 27 mmol/L (ref 20–29)
Calcium: 10.1 mg/dL (ref 8.6–10.2)
Chloride: 98 mmol/L (ref 96–106)
Creatinine, Ser: 1.18 mg/dL (ref 0.76–1.27)
Globulin, Total: 3 g/dL (ref 1.5–4.5)
Glucose: 107 mg/dL — ABNORMAL HIGH (ref 70–99)
Potassium: 3.9 mmol/L (ref 3.5–5.2)
Sodium: 141 mmol/L (ref 134–144)
Total Protein: 7.7 g/dL (ref 6.0–8.5)
eGFR: 68 mL/min/{1.73_m2} (ref >59)

## 2022-09-10 LAB — LIPID PANEL
Chol/HDL Ratio: 3 ratio (ref 0.0–5.0)
Cholesterol, Total: 171 mg/dL (ref 100–199)
HDL: 57 mg/dL (ref >39)
LDL Chol Calc (NIH): 97 mg/dL (ref 0–99)
Triglycerides: 92 mg/dL (ref 0–149)
VLDL Cholesterol Cal: 17 mg/dL (ref 5–40)

## 2022-09-15 ENCOUNTER — Encounter: Payer: Self-pay | Admitting: Nurse Practitioner

## 2022-09-22 ENCOUNTER — Other Ambulatory Visit: Payer: Self-pay | Admitting: Family Medicine

## 2022-09-22 DIAGNOSIS — E118 Type 2 diabetes mellitus with unspecified complications: Secondary | ICD-10-CM

## 2022-10-06 ENCOUNTER — Other Ambulatory Visit: Payer: Self-pay

## 2022-10-16 ENCOUNTER — Other Ambulatory Visit: Payer: Self-pay

## 2022-10-31 ENCOUNTER — Other Ambulatory Visit: Payer: Self-pay

## 2022-11-14 ENCOUNTER — Other Ambulatory Visit: Payer: Self-pay

## 2022-12-08 ENCOUNTER — Other Ambulatory Visit: Payer: Self-pay

## 2022-12-09 ENCOUNTER — Other Ambulatory Visit: Payer: Self-pay

## 2022-12-16 ENCOUNTER — Other Ambulatory Visit: Payer: Self-pay

## 2022-12-17 ENCOUNTER — Other Ambulatory Visit: Payer: Self-pay

## 2022-12-30 ENCOUNTER — Other Ambulatory Visit: Payer: Self-pay

## 2022-12-30 MED ORDER — LATANOPROST 0.005 % OP SOLN
1.0000 [drp] | Freq: Every evening | OPHTHALMIC | 4 refills | Status: DC
  Filled 2022-12-30: qty 7.5, 75d supply, fill #0

## 2023-01-06 ENCOUNTER — Other Ambulatory Visit: Payer: Self-pay

## 2023-01-15 ENCOUNTER — Telehealth: Payer: Self-pay | Admitting: Nurse Practitioner

## 2023-01-15 NOTE — Telephone Encounter (Signed)
Contacted Marvis Repress to schedule their annual wellness visit. Appointment made for 01/15/13.  [place]ark[facility] [employer] AWV direct phone # [phone]

## 2023-01-16 ENCOUNTER — Ambulatory Visit: Payer: 59 | Attending: Nurse Practitioner

## 2023-01-16 VITALS — Ht 68.0 in | Wt 174.0 lb

## 2023-01-16 DIAGNOSIS — Z0000 Encounter for general adult medical examination without abnormal findings: Secondary | ICD-10-CM | POA: Diagnosis not present

## 2023-01-16 NOTE — Patient Instructions (Signed)
Ronald Massey , Thank you for taking time to come for your Medicare Wellness Visit. I appreciate your ongoing commitment to your health goals. Please review the following plan we discussed and let me know if I can assist you in the future.   These are the goals we discussed:  Goals      Patient Stated     01/16/2023, trying to drink more water        This is a list of the screening recommended for you and due dates:  Health Maintenance  Topic Date Due   COVID-19 Vaccine (1) Never done   Colon Cancer Screening  Never done   Zoster (Shingles) Vaccine (1 of 2) Never done   Yearly kidney health urinalysis for diabetes  08/01/2021   Eye exam for diabetics  08/01/2021   Complete foot exam   05/24/2022   Pneumonia Vaccine (1 of 1 - PCV) 02/28/2023*   Hemoglobin A1C  03/10/2023   Yearly kidney function blood test for diabetes  09/10/2023   Medicare Annual Wellness Visit  01/16/2024   DTaP/Tdap/Td vaccine (2 - Td or Tdap) 04/15/2027   Flu Shot  Completed   Hepatitis C Screening: USPSTF Recommendation to screen - Ages 18-79 yo.  Completed   HPV Vaccine  Aged Out  *Topic was postponed. The date shown is not the original due date.    Advanced directives: Advance directive discussed with you today.    Conditions/risks identified: none  Next appointment: Follow up in one year for your annual wellness visit.   Preventive Care 73 Years and Older, Male  Preventive care refers to lifestyle choices and visits with your health care provider that can promote health and wellness. What does preventive care include? A yearly physical exam. This is also called an annual well check. Dental exams once or twice a year. Routine eye exams. Ask your health care provider how often you should have your eyes checked. Personal lifestyle choices, including: Daily care of your teeth and gums. Regular physical activity. Eating a healthy diet. Avoiding tobacco and drug use. Limiting alcohol use. Practicing  safe sex. Taking low doses of aspirin every day. Taking vitamin and mineral supplements as recommended by your health care provider. What happens during an annual well check? The services and screenings done by your health care provider during your annual well check will depend on your age, overall health, lifestyle risk factors, and family history of disease. Counseling  Your health care provider may ask you questions about your: Alcohol use. Tobacco use. Drug use. Emotional well-being. Home and relationship well-being. Sexual activity. Eating habits. History of falls. Memory and ability to understand (cognition). Work and work Statistician. Screening  You may have the following tests or measurements: Height, weight, and BMI. Blood pressure. Lipid and cholesterol levels. These may be checked every 5 years, or more frequently if you are over 86 years old. Skin check. Lung cancer screening. You may have this screening every year starting at age 31 if you have a 30-pack-year history of smoking and currently smoke or have quit within the past 15 years. Fecal occult blood test (FOBT) of the stool. You may have this test every year starting at age 64. Flexible sigmoidoscopy or colonoscopy. You may have a sigmoidoscopy every 5 years or a colonoscopy every 10 years starting at age 33. Prostate cancer screening. Recommendations will vary depending on your family history and other risks. Hepatitis C blood test. Hepatitis B blood test. Sexually transmitted disease (STD) testing.  Diabetes screening. This is done by checking your blood sugar (glucose) after you have not eaten for a while (fasting). You may have this done every 1-3 years. Abdominal aortic aneurysm (AAA) screening. You may need this if you are a current or former smoker. Osteoporosis. You may be screened starting at age 30 if you are at high risk. Talk with your health care provider about your test results, treatment options, and  if necessary, the need for more tests. Vaccines  Your health care provider may recommend certain vaccines, such as: Influenza vaccine. This is recommended every year. Tetanus, diphtheria, and acellular pertussis (Tdap, Td) vaccine. You may need a Td booster every 10 years. Zoster vaccine. You may need this after age 24. Pneumococcal 13-valent conjugate (PCV13) vaccine. One dose is recommended after age 60. Pneumococcal polysaccharide (PPSV23) vaccine. One dose is recommended after age 83. Talk to your health care provider about which screenings and vaccines you need and how often you need them. This information is not intended to replace advice given to you by your health care provider. Make sure you discuss any questions you have with your health care provider. Document Released: 11/16/2015 Document Revised: 07/09/2016 Document Reviewed: 08/21/2015 Elsevier Interactive Patient Education  2017 [place] Prevention in the Home Falls can cause injuries. They can happen to people of all ages. There are many things you can do to make your home safe and to help prevent falls. What can I do on the outside of my home? Regularly fix the edges of walkways and driveways and fix any cracks. Remove anything that might make you trip as you walk through a door, such as a raised step or threshold. Trim any bushes or trees on the path to your home. Use bright outdoor lighting. Clear any walking paths of anything that might make someone trip, such as rocks or tools. Regularly check to see if handrails are loose or broken. Make sure that both sides of any steps have handrails. Any raised decks and porches should have guardrails on the edges. Have any leaves, snow, or ice cleared regularly. Use sand or salt on walking paths during winter. Clean up any spills in your garage right away. This includes oil or grease spills. What can I do in the bathroom? Use night lights. Install grab bars by the  toilet and in the tub and shower. Do not use towel bars as grab bars. Use non-skid mats or decals in the tub or shower. If you need to sit down in the shower, use a plastic, non-slip stool. Keep the floor dry. Clean up any water that spills on the floor as soon as it happens. Remove soap buildup in the tub or shower regularly. Attach bath mats securely with double-sided non-slip rug tape. Do not have throw rugs and other things on the floor that can make you trip. What can I do in the bedroom? Use night lights. Make sure that you have a light by your bed that is easy to reach. Do not use any sheets or blankets that are too big for your bed. They should not hang down onto the floor. Have a firm chair that has side arms. You can use this for support while you get dressed. Do not have throw rugs and other things on the floor that can make you trip. What can I do in the kitchen? Clean up any spills right away. Avoid walking on wet floors. Keep items that you use a lot in easy-to-reach  places. If you need to reach something above you, use a strong step stool that has a grab bar. Keep electrical cords out of the way. Do not use floor polish or wax that makes floors slippery. If you must use wax, use non-skid floor wax. Do not have throw rugs and other things on the floor that can make you trip. What can I do with my stairs? Do not leave any items on the stairs. Make sure that there are handrails on both sides of the stairs and use them. Fix handrails that are broken or loose. Make sure that handrails are as long as the stairways. Check any carpeting to make sure that it is firmly attached to the stairs. Fix any carpet that is loose or worn. Avoid having throw rugs at the top or bottom of the stairs. If you do have throw rugs, attach them to the floor with carpet tape. Make sure that you have a light switch at the top of the stairs and the bottom of the stairs. If you do not have them, ask someone  to add them for you. What else can I do to help prevent falls? Wear shoes that: Do not have high heels. Have rubber bottoms. Are comfortable and fit you well. Are closed at the toe. Do not wear sandals. If you use a stepladder: Make sure that it is fully opened. Do not climb a closed stepladder. Make sure that both sides of the stepladder are locked into place. Ask someone to hold it for you, if possible. Clearly mark and make sure that you can see: Any grab bars or handrails. First and last steps. Where the edge of each step is. Use tools that help you move around (mobility aids) if they are needed. These include: Canes. Walkers. Scooters. Crutches. Turn on the lights when you go into a dark area. Replace any light bulbs as soon as they burn out. Set up your furniture so you have a clear path. Avoid moving your furniture around. If any of your floors are uneven, fix them. If there are any pets around you, be aware of where they are. Review your medicines with your doctor. Some medicines can make you feel dizzy. This can increase your chance of falling. Ask your doctor what other things that you can do to help prevent falls. This information is not intended to replace advice given to you by your health care provider. Make sure you discuss any questions you have with your health care provider. Document Released: 08/16/2009 Document Revised: 03/27/2016 Document Reviewed: 11/24/2014 Elsevier Interactive Patient Education  2017 [employer].

## 2023-01-16 NOTE — Progress Notes (Signed)
I connected with  Ronald Massey on 01/16/23 by a audio enabled telemedicine application and verified that I am speaking with the correct person using two identifiers.  Patient Location: Home  Provider Location: Office/Clinic  I discussed the limitations of evaluation and management by telemedicine. The patient expressed understanding and agreed to proceed.  Subjective:   Ronald Massey is a 67 y.o. male who presents for an Initial Medicare Annual Wellness Visit.  Review of Systems     Cardiac Risk Factors include: advanced age (>30men, >94 women);diabetes mellitus;dyslipidemia;hypertension;male gender     Objective:    Today's Vitals   01/16/23 1556  Weight: 174 lb (78.9 kg)  Height: 5\' 8"  (1.727 m)   Body mass index is 26.46 kg/m.     01/16/2023    4:06 PM 06/02/2017   11:36 AM 04/14/2017    2:55 PM 05/26/2016    3:46 PM 02/08/2016    5:25 PM  Advanced Directives  Does Patient Have a Medical Advance Directive? No No No No No  Would patient like information on creating a medical advance directive?    No - patient declined information No - patient declined information    Current Medications (verified) Outpatient Encounter Medications as of 01/16/2023  Medication Sig   amLODipine (NORVASC) 5 MG tablet Take 1 tablet (5 mg total) by mouth daily. FOR BLOOD PRESSURE   atorvastatin (LIPITOR) 20 MG tablet Take 1 tablet (20 mg total) by mouth daily.   Blood Glucose Monitoring Suppl (CONTOUR NEXT EZ) w/Device KIT USE AS DIRECTED TWICE DAILY   celecoxib (CELEBREX) 200 MG capsule Take by mouth.   chlorthalidone (HYGROTON) 25 MG tablet Take 1 tablet (25 mg total) by mouth daily.   Dulaglutide (TRULICITY) [id number] 0000000 SOPN Inject 0.75 mg into the skin once a week.   metFORMIN (GLUCOPHAGE) 500 MG tablet Take 1 tablet (500 mg total) by mouth 2 (two) times daily with a meal.   Microlet Lancets MISC Check blood sugar twice daily. Dx E11.8   Travoprost, BAK Free, (TRAVATAN Z) 0.004 %  SOLN ophthalmic solution PLACE 1 DROP INTO BOTH EYES AT BEDTIME.   triamcinolone ointment (KENALOG) 0.1 % Apply topically 2 (two) times daily.   latanoprost (XALATAN) 0.005 % ophthalmic solution Instill 1 drop into both eyes every evening as directed. (Patient not taking: Reported on 01/16/2023)   latanoprost (XALATAN) 0.005 % ophthalmic solution Place 1 drop into both eyes every evening. (Patient not taking: Reported on 01/16/2023)   Latanoprostene Bunod (VYZULTA) 0.024 % SOLN Place 1 drop into both eyes every evening. (Patient not taking: Reported on 01/16/2023)   LUMIGAN 0.01 % SOLN Place 1 drop into both eyes every evening as directed. (Patient not taking: Reported on 01/16/2023)   Travoprost, BAK Free, (TRAVATAN Z) 0.004 % SOLN ophthalmic solution Instill 1 drop into both eyes every evening as directed   Travoprost, BAK Free, (TRAVATAN) 0.004 % SOLN ophthalmic solution Instill 1 drop into both eye every night as directed.   No facility-administered encounter medications on file as of 01/16/2023.    Allergies (verified) Patient has no known allergies.   History: Past Medical History:  Diagnosis Date   Dental cavities 12/25/2014   Diabetes mellitus without complication ([place])    Hyperlipidemia    Hypertension    Onychomycosis of toenail 03/04/2016   Pollen allergies    Tinea pedis of both feet 03/04/2016   Past Surgical History:  Procedure Laterality Date   EYE SURGERY     Family History  Problem Relation Age of Onset   Hypertension Mother    Social History   Socioeconomic History   Marital status: Married    Spouse name: Not on file   Number of children: Not on file   Years of education: Not on file   Highest education level: Not on file  Occupational History   Not on file  Tobacco Use   Smoking status: Never   Smokeless tobacco: Never  Vaping Use   Vaping Use: Never used  Substance and Sexual Activity   Alcohol use: No   Drug use: No   Sexual activity: Not on file   Other Topics Concern   Not on file  Social History Narrative   Not on file   Social Determinants of Health   Financial Resource Strain: Low Risk  (01/16/2023)   Overall Financial Resource Strain (CARDIA)    Difficulty of Paying Living Expenses: Not hard at all  Food Insecurity: Food Insecurity Present (01/16/2023)   Hunger Vital Sign    Worried About [place] in the Last Year: Sometimes true    Ran Out of Food in the Last Year: Sometimes true  Transportation Needs: No Transportation Needs (01/16/2023)   PRAPARE - Hydrologist (Medical): No    Lack of Transportation (Non-Medical): No  Physical Activity: Sufficiently Active (01/16/2023)   Exercise Vital Sign    Days of Exercise per Week: 7 days    Minutes of Exercise per Session: 30 min  Stress: No Stress Concern Present (01/16/2023)   [place]    Feeling of Stress : Not at all  Social Connections: Not on file    Tobacco Counseling Counseling given: Not Answered   Clinical Intake:  Pre-visit preparation completed: Yes  Pain : No/denies pain     Nutritional Status: BMI 25 -29 Overweight Nutritional Risks: None Diabetes: Yes  How often do you need to have someone help you when you read instructions, pamphlets, or other written materials from your doctor or pharmacy?: 1 - Never  Diabetic? Yes Nutrition Risk Assessment:  Has the patient had any N/V/D within the last 2 months?  No  Does the patient have any non-healing wounds?  No  Has the patient had any unintentional weight loss or weight gain?  No   Diabetes:  Is the patient diabetic?  Yes  If diabetic, was a CBG obtained today?  No  Did the patient bring in their glucometer from home?  No  How often do you monitor your CBG's? daily.   Financial Strains and Diabetes Management:  Are you having any financial strains with the device, your supplies or your  medication? No .  Does the patient want to be seen by Chronic Care Management for management of their diabetes?  No  Would the patient like to be referred to a Nutritionist or for Diabetic Management?  No   Diabetic Exams:  Diabetic Eye Exam: Overdue for diabetic eye exam. Pt has been advised about the importance in completing this exam. Patient advised to call and schedule an eye exam. Diabetic Foot Exam: Overdue, Pt has been advised about the importance in completing this exam. Pt is scheduled for diabetic foot exam on next appointment.   Interpreter Needed?: No  Information entered by :: NAllen LPN   Activities of Daily Living    01/16/2023    4:07 PM  In your present state of health, do you have  any difficulty performing the following activities:  Hearing? 0  Vision? 0  Difficulty concentrating or making decisions? 0  Walking or climbing stairs? 0  Dressing or bathing? 0  Doing errands, shopping? 0  Preparing Food and eating ? N  Using the Toilet? N  In the past six months, have you accidently leaked urine? N  Do you have problems with loss of bowel control? N  Managing your Medications? N  Managing your Finances? N  Housekeeping or managing your Housekeeping? N    Patient Care Team: Gildardo Pounds, NP as PCP - General (Nurse Practitioner)  Indicate any recent Medical Services you may have received from other than [facility] providers in the past year (date may be approximate).     Assessment:   This is a routine wellness examination for Ronald Massey.  Hearing/Vision screen Vision Screening - Comments:: Regular eye exams, Dr. Venetia Maxon  Dietary issues and exercise activities discussed: Current Exercise Habits: Home exercise routine, Type of exercise: walking, Time (Minutes): 30, Frequency (Times/Week): 7, Weekly Exercise (Minutes/Week): 210   Goals Addressed             This Visit's Progress    Patient Stated       01/16/2023, trying to drink more water        Depression Screen    01/16/2023    4:07 PM 09/09/2022    3:04 PM 02/27/2022    2:17 PM 05/24/2021    3:32 PM 08/01/2020    2:13 PM 09/07/2019    4:36 PM 09/14/2018   11:09 AM  PHQ 2/9 Scores  PHQ - 2 Score 0 0 0 0 0 0 0  PHQ- 9 Score  0    0 0    Fall Risk    01/16/2023    4:07 PM 09/09/2022    2:57 PM 05/07/2022    3:10 PM 02/27/2022    2:16 PM 05/24/2021    3:32 PM  [place] in the past year? 0 0 0 0 0  Number falls in past yr: 0 0 0 0 0  Injury with Fall? 0 0 0 0 0  Risk for fall due to : Medication side effect    No Fall Risks  Follow up Falls prevention discussed;Education provided;Falls evaluation completed Falls evaluation completed       FALL RISK PREVENTION PERTAINING TO THE HOME:  Any stairs in or around the home? Yes  If so, are there any without handrails? No  Home free of loose throw rugs in walkways, pet beds, electrical cords, etc? Yes  Adequate lighting in your home to reduce risk of falls? Yes   ASSISTIVE DEVICES UTILIZED TO PREVENT FALLS:  Life alert? No  Use of a cane, walker or w/c? No  Grab bars in the bathroom? No  Shower chair or bench in shower? No  Elevated toilet seat or a handicapped toilet? No   TIMED UP AND GO:  Was the test performed? No .      Cognitive Function:        01/16/2023    4:08 PM  6CIT Screen  What Year? 0 points  What month? 0 points  What time? 0 points  Count back from 20 0 points  Months in reverse 0 points  Repeat phrase 6 points  Total Score 6 points    Immunizations Immunization History  Administered Date(s) Administered   Influenza Split 09/16/2013   Influenza,inj,Quad PF,6+ Mos 12/25/2014, 08/28/2016, 09/16/2017, 09/07/2018, 09/09/2022  Tdap 04/14/2017    TDAP status: Up to date  Flu Vaccine status: Up to date  Pneumococcal vaccine status: Declined,  Education has been provided regarding the importance of this vaccine but patient still declined. Advised may receive this vaccine at  local pharmacy or Health Dept. Aware to provide a copy of the vaccination record if obtained from local pharmacy or Health Dept. Verbalized acceptance and understanding.   Covid-19 vaccine status: Declined, Education has been provided regarding the importance of this vaccine but patient still declined. Advised may receive this vaccine at local pharmacy or Health Dept.or vaccine clinic. Aware to provide a copy of the vaccination record if obtained from local pharmacy or Health Dept. Verbalized acceptance and understanding.  Qualifies for Shingles Vaccine? Yes   Zostavax completed No   Shingrix Completed?: No.    Education has been provided regarding the importance of this vaccine. Patient has been advised to call insurance company to determine out of pocket expense if they have not yet received this vaccine. Advised may also receive vaccine at local pharmacy or Health Dept. Verbalized acceptance and understanding.  Screening Tests Health Maintenance  Topic Date Due   Medicare Annual Wellness (AWV)  Never done   COVID-19 Vaccine (1) Never done   COLONOSCOPY (Pts 45-6yrs Insurance coverage will need to be confirmed)  Never done   Zoster Vaccines- Shingrix (1 of 2) Never done   Diabetic kidney evaluation - Urine ACR  08/01/2021   OPHTHALMOLOGY EXAM  08/01/2021   FOOT EXAM  05/24/2022   Pneumonia Vaccine 51+ Years old (1 of 1 - PCV) 02/28/2023 (Originally 11/03/2021)   HEMOGLOBIN A1C  03/10/2023   Diabetic kidney evaluation - eGFR measurement  09/10/2023   DTaP/Tdap/Td (2 - Td or Tdap) 04/15/2027   INFLUENZA VACCINE  Completed   Hepatitis C Screening  Completed   HPV VACCINES  Aged Out    Health Maintenance  Health Maintenance Due  Topic Date Due   Medicare Annual Wellness (AWV)  Never done   COVID-19 Vaccine (1) Never done   COLONOSCOPY (Pts 45-21yrs Insurance coverage will need to be confirmed)  Never done   Zoster Vaccines- Shingrix (1 of 2) Never done   Diabetic kidney evaluation -  Urine ACR  08/01/2021   OPHTHALMOLOGY EXAM  08/01/2021   FOOT EXAM  05/24/2022    Colorectal cancer screening: due   Lung Cancer Screening: (Low Dose CT Chest recommended if Age 72-80 years, 30 pack-year currently smoking OR have quit w/in 15years.) does not qualify.   Lung Cancer Screening Referral: no  Additional Screening:  Hepatitis C Screening: does qualify; Completed 05/26/2016  Vision Screening: Recommended annual ophthalmology exams for early detection of glaucoma and other disorders of the eye. Is the patient up to date with their annual eye exam?  Yes  Who is the provider or what is the name of the office in which the patient attends annual eye exams? Dr. Venetia Maxon If pt is not established with a provider, would they like to be referred to a provider to establish care? No .   Dental Screening: Recommended annual dental exams for proper oral hygiene  Community Resource Referral / Chronic Care Management: CRR required this visit?  No   CCM required this visit?  No      Plan:     I have personally reviewed and noted the following in the patient's chart:   Medical and social history Use of alcohol, tobacco or illicit drugs  Current medications and supplements  including opioid prescriptions. Patient is not currently taking opioid prescriptions. Functional ability and status Nutritional status Physical activity Advanced directives List of other physicians Hospitalizations, surgeries, and ER visits in previous 12 months Vitals Screenings to include cognitive, depression, and falls Referrals and appointments  In addition, I have reviewed and discussed with patient certain preventive protocols, quality metrics, and best practice recommendations. A written personalized care plan for preventive services as well as general preventive health recommendations were provided to patient.     Kellie Simmering, LPN   [id number]   Nurse Notes: none  Due to this being a virtual  visit, the after visit summary with patients personalized plan was offered to patient via mail or my-chart.  to pick up at office at next visit

## 2023-01-20 ENCOUNTER — Other Ambulatory Visit (HOSPITAL_COMMUNITY): Payer: Self-pay

## 2023-01-20 ENCOUNTER — Other Ambulatory Visit: Payer: Self-pay

## 2023-01-30 ENCOUNTER — Ambulatory Visit: Payer: Medicare Other | Admitting: Nurse Practitioner

## 2023-02-02 ENCOUNTER — Other Ambulatory Visit: Payer: Self-pay

## 2023-02-09 ENCOUNTER — Other Ambulatory Visit: Payer: Self-pay

## 2023-02-10 ENCOUNTER — Other Ambulatory Visit: Payer: Self-pay

## 2023-02-16 ENCOUNTER — Other Ambulatory Visit: Payer: Self-pay

## 2023-02-16 ENCOUNTER — Ambulatory Visit: Payer: 59 | Attending: Nurse Practitioner | Admitting: Nurse Practitioner

## 2023-02-16 ENCOUNTER — Encounter: Payer: Self-pay | Admitting: Nurse Practitioner

## 2023-02-16 VITALS — BP 137/85 | HR 68 | Temp 98.0°F | Ht 68.0 in | Wt 168.0 lb

## 2023-02-16 DIAGNOSIS — E1169 Type 2 diabetes mellitus with other specified complication: Secondary | ICD-10-CM

## 2023-02-16 DIAGNOSIS — J3089 Other allergic rhinitis: Secondary | ICD-10-CM | POA: Diagnosis not present

## 2023-02-16 DIAGNOSIS — I10 Essential (primary) hypertension: Secondary | ICD-10-CM | POA: Diagnosis not present

## 2023-02-16 DIAGNOSIS — E785 Hyperlipidemia, unspecified: Secondary | ICD-10-CM

## 2023-02-16 DIAGNOSIS — Z1211 Encounter for screening for malignant neoplasm of colon: Secondary | ICD-10-CM

## 2023-02-16 DIAGNOSIS — E118 Type 2 diabetes mellitus with unspecified complications: Secondary | ICD-10-CM | POA: Diagnosis not present

## 2023-02-16 LAB — POCT GLYCOSYLATED HEMOGLOBIN (HGB A1C): HbA1c, POC (controlled diabetic range): 8.4 % — AB (ref 0.0–7.0)

## 2023-02-16 LAB — GLUCOSE, POCT (MANUAL RESULT ENTRY): POC Glucose: 121 mg/dl — AB (ref 70–99)

## 2023-02-16 MED ORDER — LORATADINE 10 MG PO TABS
10.0000 mg | ORAL_TABLET | Freq: Every day | ORAL | 3 refills | Status: DC
Start: 2023-02-16 — End: 2024-03-30
  Filled 2023-02-16: qty 90, 90d supply, fill #0

## 2023-02-16 MED ORDER — TRULICITY 0.75 MG/0.5ML ~~LOC~~ SOAJ
0.7500 mg | SUBCUTANEOUS | 3 refills | Status: DC
Start: 2023-02-16 — End: 2023-09-18
  Filled 2023-02-16 – 2023-03-10 (×2): qty 6, 84d supply, fill #0
  Filled 2023-06-29: qty 2, 28d supply, fill #1
  Filled 2023-07-28: qty 2, 28d supply, fill #2
  Filled 2023-08-28: qty 2, 28d supply, fill #3

## 2023-02-16 NOTE — Progress Notes (Signed)
Assessment & Plan:  Ronald Massey was seen today for diabetes.  Diagnoses and all orders for this visit:  Type 2 diabetes mellitus with complication, without long-term current use of insulin Take metformin 500 mg BID not once a day Continue trulicity 0.75mg  weekly -     POCT glucose (manual entry) -     POCT glycosylated hemoglobin (Hb A1C) -     Dulaglutide (TRULICITY) 0.75 MG/0.5ML SOPN; Inject 0.75 mg into the skin once a week. Please fill as a 90 day supply -     CMP14+EGFR -     Microalbumin / creatinine urine ratio -     Cancel: Ambulatory referral to Ophthalmology  Primary hypertension Well controlled Continue all antihypertensives as prescribed.  Reminded to bring in blood pressure log for follow  up appointment.  RECOMMENDATIONS: DASH/Mediterranean Diets are healthier choices for HTN.     Hyperlipidemia associated with type 2 diabetes mellitus INSTRUCTIONS: Work on a low fat, heart healthy diet and participate in regular aerobic exercise program by working out at least 150 minutes per week; 5 days a week-30 minutes per day. Avoid red meat/beef/steak,  fried foods. junk foods, sodas, sugary drinks, unhealthy snacking, alcohol and smoking.  Drink at least 80 oz of water per day and monitor your carbohydrate intake daily.     Colon cancer screening -     Ambulatory referral to Gastroenterology  Environmental and seasonal allergies -     loratadine (CLARITIN) 10 MG tablet; Take 1 tablet (10 mg total) by mouth daily.    Patient has been counseled on age-appropriate routine health concerns for screening and prevention. These are reviewed and up-to-date. Referrals have been placed accordingly. Immunizations are up-to-date or declined.    Subjective:   Chief Complaint  Patient presents with   Diabetes    DM f/u.  Reports that injection (maybe trulicity) is causing swelling around area lasts X2-3 days.  Seasonal allergies, sneezing, ictchy eyes - requesting med for allergy     Ronald Massey 67 y.o. male presents to office today for follow up to DM and HTN  Patient has been counseled on age-appropriate routine health concerns for screening and prevention. These are reviewed and up-to-date. Referrals have been placed accordingly. Immunizations are up-to-date or declined.     COLONOSCOPY: Overdue.  Referral placed today PSA: Up-to-date    Will be traveling to [place] next month.   Requesting allergy medication due to current symptoms of allergies including sneezing and itchy watery eyes.  DM 2 Mr Veltman states since his last visit he has not been walking/exercising or taking his metformin as prescribed. He does endorse taking Trulicity 0.75 mg weekly as instructed.  He also states he was fasting for the month of Ramadan last month and since then has been eating sugary drinks and food.  As he has not been taking his metformin and due to religious holiday I will not be making any additional medication changes aside from instructing him to reimplement his metformin to twice a day and to begin exercising daily as he was doing previously.  He is to continue on Trulicity as well. Lab Results  Component Value Date   HGBA1C 8.4 (A) 02/16/2023  LDL not quite at goal with atorvastatin 20 mg daily. Lab Results  Component Value Date   LDLCALC 97 09/09/2022    HTN Blood pressure is well-controlled with amlodipine 5 mg daily and chlorthalidone 25 mg daily.  He endorses adherence taking both as prescribed BP Readings from  Last 3 Encounters:  02/16/23 137/85  09/09/22 122/84  05/07/22 116/76    Review of Systems  Constitutional:  Negative for fever, malaise/fatigue and weight loss.  HENT: Negative.  Negative for nosebleeds.        SEE HPI  Eyes:  Positive for redness. Negative for blurred vision, double vision and photophobia.       See HPI  Respiratory: Negative.  Negative for cough and shortness of breath.   Cardiovascular: Negative.  Negative for chest pain,  palpitations and leg swelling.  Gastrointestinal: Negative.  Negative for heartburn, nausea and vomiting.  Musculoskeletal: Negative.  Negative for myalgias.  Neurological: Negative.  Negative for dizziness, focal weakness, seizures and headaches.  Endo/Heme/Allergies:  Positive for environmental allergies.  Psychiatric/Behavioral: Negative.  Negative for suicidal ideas.     Past Medical History:  Diagnosis Date   Dental cavities 12/25/2014   Diabetes mellitus without complication    Hyperlipidemia    Hypertension    Onychomycosis of toenail 03/04/2016   Pollen allergies    Tinea pedis of both feet 03/04/2016    Past Surgical History:  Procedure Laterality Date   EYE SURGERY      Family History  Problem Relation Age of Onset   Hypertension Mother     Social History Reviewed with no changes to be made today.   Outpatient Medications Prior to Visit  Medication Sig Dispense Refill   amLODipine (NORVASC) 5 MG tablet Take 1 tablet (5 mg total) by mouth daily. FOR BLOOD PRESSURE 90 tablet 1   atorvastatin (LIPITOR) 20 MG tablet Take 1 tablet (20 mg total) by mouth daily. 90 tablet 1   Blood Glucose Monitoring Suppl (CONTOUR NEXT EZ) w/Device KIT USE AS DIRECTED TWICE DAILY 1 kit 0   chlorthalidone (HYGROTON) 25 MG tablet Take 1 tablet (25 mg total) by mouth daily. 90 tablet 1   metFORMIN (GLUCOPHAGE) 500 MG tablet Take 1 tablet (500 mg total) by mouth 2 (two) times daily with a meal. 180 tablet 1   Microlet Lancets MISC Check blood sugar twice daily. Dx E11.8 100 each 2   Travoprost, BAK Free, (TRAVATAN Z) 0.004 % SOLN ophthalmic solution PLACE 1 DROP INTO BOTH EYES AT BEDTIME. 2.5 mL 1   celecoxib (CELEBREX) 200 MG capsule Take by mouth.     Dulaglutide (TRULICITY) 0.75 MG/0.5ML SOPN Inject 0.75 mg into the skin once a week. 6 mL 1   latanoprost (XALATAN) 0.005 % ophthalmic solution Instill 1 drop into both eyes every evening as directed. 7.5 mL 4   latanoprost (XALATAN) 0.005 %  ophthalmic solution Place 1 drop into both eyes every evening. 7.5 mL 4   Latanoprostene Bunod (VYZULTA) 0.024 % SOLN Place 1 drop into both eyes every evening. 2.5 mL 3   LUMIGAN 0.01 % SOLN Place 1 drop into both eyes every evening as directed. 7.5 mL 3   Travoprost, BAK Free, (TRAVATAN Z) 0.004 % SOLN ophthalmic solution Instill 1 drop into both eyes every evening as directed 2.5 mL 3   Travoprost, BAK Free, (TRAVATAN) 0.004 % SOLN ophthalmic solution Instill 1 drop into both eye every night as directed. 2.5 mL 3   triamcinolone ointment (KENALOG) 0.1 % Apply topically 2 (two) times daily. 60 g 3   No facility-administered medications prior to visit.    No Known Allergies     Objective:    BP 137/85 (BP Location: Left Arm, Patient Position: Sitting, Cuff Size: Normal)   Pulse 68   Temp 98  F (36.7 C) (Oral)   Ht  (1.727 m)   Wt 168 lb (76.2 kg)   SpO2 97%   BMI 25.54 kg/m  Wt Readings from Last 3 Encounters:  02/16/23 168 lb (76.2 kg)  01/16/23 174 lb (78.9 kg)  09/09/22 169 lb (76.7 kg)    Physical Exam       Patient has been counseled extensively about nutrition and exercise as well as the importance of adherence with medications and regular follow-up. The patient was given clear instructions to go to ER or return to medical center if symptoms don't improve, worsen or new problems develop. The patient verbalized understanding.   Follow-up: Return in about 3 months (around 05/18/2023).   Claiborne Rigg, FNP-BC [facility] [place], [place] [phone]   02/16/2023, 4:52 PM

## 2023-02-17 ENCOUNTER — Other Ambulatory Visit: Payer: Self-pay

## 2023-02-17 LAB — CMP14+EGFR
ALT: 31 IU/L (ref 0–44)
AST: 25 IU/L (ref 0–40)
Albumin/Globulin Ratio: 1.6 (ref 1.2–2.2)
Albumin: 4.5 g/dL (ref 3.9–4.9)
Alkaline Phosphatase: 71 IU/L (ref 44–121)
BUN/Creatinine Ratio: 15 (ref 10–24)
BUN: 17 mg/dL (ref 8–27)
Bilirubin Total: 0.6 mg/dL (ref 0.0–1.2)
CO2: 24 mmol/L (ref 20–29)
Calcium: 9.8 mg/dL (ref 8.6–10.2)
Chloride: 102 mmol/L (ref 96–106)
Creatinine, Ser: 1.11 mg/dL (ref 0.76–1.27)
Globulin, Total: 2.9 g/dL (ref 1.5–4.5)
Glucose: 106 mg/dL — ABNORMAL HIGH (ref 70–99)
Potassium: 4.2 mmol/L (ref 3.5–5.2)
Sodium: 141 mmol/L (ref 134–144)
Total Protein: 7.4 g/dL (ref 6.0–8.5)
eGFR: 73 mL/min/{1.73_m2} (ref >59)

## 2023-02-18 LAB — MICROALBUMIN / CREATININE URINE RATIO
Creatinine, Urine: 187 mg/dL
Microalb/Creat Ratio: 2 mg/g creat (ref 0–29)
Microalbumin, Urine: 4.6 ug/mL

## 2023-02-20 ENCOUNTER — Other Ambulatory Visit: Payer: Self-pay

## 2023-03-10 ENCOUNTER — Other Ambulatory Visit: Payer: Self-pay

## 2023-03-11 ENCOUNTER — Other Ambulatory Visit: Payer: Self-pay

## 2023-03-11 DIAGNOSIS — Z961 Presence of intraocular lens: Secondary | ICD-10-CM | POA: Diagnosis not present

## 2023-03-11 DIAGNOSIS — H401131 Primary open-angle glaucoma, bilateral, mild stage: Secondary | ICD-10-CM | POA: Diagnosis not present

## 2023-03-11 DIAGNOSIS — E119 Type 2 diabetes mellitus without complications: Secondary | ICD-10-CM | POA: Diagnosis not present

## 2023-03-11 MED ORDER — TRAVOPROST (BAK FREE) 0.004 % OP SOLN
1.0000 [drp] | Freq: Every evening | OPHTHALMIC | 4 refills | Status: DC
  Filled 2023-03-11: qty 10, 100d supply, fill #0

## 2023-03-16 ENCOUNTER — Other Ambulatory Visit: Payer: Self-pay

## 2023-04-02 ENCOUNTER — Other Ambulatory Visit: Payer: Self-pay

## 2023-05-01 ENCOUNTER — Other Ambulatory Visit: Payer: Self-pay

## 2023-05-18 ENCOUNTER — Ambulatory Visit: Payer: 59 | Admitting: Nurse Practitioner

## 2023-05-25 ENCOUNTER — Ambulatory Visit: Payer: Self-pay

## 2023-05-25 ENCOUNTER — Other Ambulatory Visit (HOSPITAL_COMMUNITY): Payer: Self-pay

## 2023-05-25 ENCOUNTER — Encounter (HOSPITAL_COMMUNITY): Payer: Self-pay | Admitting: Student-PharmD

## 2023-05-25 ENCOUNTER — Other Ambulatory Visit: Payer: Self-pay

## 2023-05-25 ENCOUNTER — Ambulatory Visit (HOSPITAL_BASED_OUTPATIENT_CLINIC_OR_DEPARTMENT_OTHER)
Admission: RE | Admit: 2023-05-25 | Discharge: 2023-05-25 | Disposition: A | Discharge status: Home / Self Care | Payer: 59 | Source: Ambulatory Visit | Attending: Vascular Surgery | Admitting: Vascular Surgery

## 2023-05-25 ENCOUNTER — Ambulatory Visit: Payer: 59 | Attending: Nurse Practitioner | Admitting: Internal Medicine

## 2023-05-25 ENCOUNTER — Encounter: Payer: Self-pay | Admitting: Internal Medicine

## 2023-05-25 ENCOUNTER — Ambulatory Visit (HOSPITAL_COMMUNITY)
Admission: RE | Admit: 2023-05-25 | Discharge: 2023-05-25 | Disposition: A | Discharge status: Home / Self Care | Payer: 59 | Source: Ambulatory Visit | Attending: Internal Medicine | Admitting: Internal Medicine

## 2023-05-25 VITALS — BP 139/78 | HR 84

## 2023-05-25 VITALS — BP 130/68 | HR 65 | Temp 97.5°F | Ht 68.0 in | Wt 165.0 lb

## 2023-05-25 DIAGNOSIS — E1159 Type 2 diabetes mellitus with other circulatory complications: Secondary | ICD-10-CM | POA: Diagnosis not present

## 2023-05-25 DIAGNOSIS — Z7985 Long-term (current) use of injectable non-insulin antidiabetic drugs: Secondary | ICD-10-CM | POA: Diagnosis not present

## 2023-05-25 DIAGNOSIS — R051 Acute cough: Secondary | ICD-10-CM

## 2023-05-25 DIAGNOSIS — E1169 Type 2 diabetes mellitus with other specified complication: Secondary | ICD-10-CM | POA: Diagnosis not present

## 2023-05-25 DIAGNOSIS — M7989 Other specified soft tissue disorders: Secondary | ICD-10-CM | POA: Diagnosis not present

## 2023-05-25 DIAGNOSIS — I82452 Acute embolism and thrombosis of left peroneal vein: Secondary | ICD-10-CM | POA: Insufficient documentation

## 2023-05-25 DIAGNOSIS — I152 Hypertension secondary to endocrine disorders: Secondary | ICD-10-CM | POA: Diagnosis not present

## 2023-05-25 DIAGNOSIS — E785 Hyperlipidemia, unspecified: Secondary | ICD-10-CM | POA: Diagnosis not present

## 2023-05-25 DIAGNOSIS — Z7984 Long term (current) use of oral hypoglycemic drugs: Secondary | ICD-10-CM

## 2023-05-25 LAB — POCT GLYCOSYLATED HEMOGLOBIN (HGB A1C): HbA1c, POC (controlled diabetic range): 7.4 % — AB (ref 0.0–7.0)

## 2023-05-25 LAB — GLUCOSE, POCT (MANUAL RESULT ENTRY): POC Glucose: 144 mg/dl — AB (ref 70–99)

## 2023-05-25 MED ORDER — ATORVASTATIN CALCIUM 20 MG PO TABS
20.0000 mg | ORAL_TABLET | Freq: Every day | ORAL | 1 refills | Status: DC
Start: 2023-05-25 — End: 2024-03-30
  Filled 2023-05-25 – 2023-10-23 (×2): qty 90, 90d supply, fill #0

## 2023-05-25 MED ORDER — APIXABAN (ELIQUIS) VTE STARTER PACK (10MG AND 5MG)
ORAL_TABLET | ORAL | 0 refills | Status: DC
  Filled 2023-05-25: qty 74, 30d supply, fill #0

## 2023-05-25 MED ORDER — ATORVASTATIN CALCIUM 20 MG PO TABS
20.0000 mg | ORAL_TABLET | Freq: Every day | ORAL | 1 refills | Status: DC
  Filled 2023-05-25: qty 90, 90d supply, fill #0

## 2023-05-25 MED ORDER — APIXABAN 5 MG PO TABS
5.0000 mg | ORAL_TABLET | Freq: Two times a day (BID) | ORAL | 1 refills | Status: DC
  Filled 2023-05-25: qty 60, 30d supply, fill #0
  Filled 2023-06-25 (×2): qty 120, 60d supply, fill #0

## 2023-05-25 MED ORDER — BENZONATATE 100 MG PO CAPS
100.0000 mg | ORAL_CAPSULE | Freq: Two times a day (BID) | ORAL | 0 refills | Status: DC | PRN
Start: 2023-05-25 — End: 2024-03-30
  Filled 2023-05-25: qty 20, 10d supply, fill #0

## 2023-05-25 MED ORDER — CHLORTHALIDONE 25 MG PO TABS
25.0000 mg | ORAL_TABLET | Freq: Every day | ORAL | 1 refills | Status: DC
Start: 2023-05-25 — End: 2024-03-30
  Filled 2023-05-25: qty 90, 90d supply, fill #0

## 2023-05-25 NOTE — Telephone Encounter (Signed)
Left lower DVT left perineal vein positive for DVT. Teams messaged Cassandra gave the number to transfer call to. Tammy Sours wants to talk to provider to see how to proceed. After wait transferred the call to Dr. Laural Benes.

## 2023-05-25 NOTE — Progress Notes (Signed)
Left lower extremity venous duplex has been completed. Preliminary results can be found in CV Proc through chart review.  Results were given to Dr. Laural Benes.  05/25/23 1:20 PM Olen Cordial RVT

## 2023-05-25 NOTE — Patient Instructions (Signed)
-  Start apixaban (Eliquis) 10 mg twice daily for 7 days followed by 5 mg twice daily. -Your refills have been sent to [facility]. You may need to call the pharmacy to ask them to fill this when you start to run low on your current supply.  -It is important to take your medication around the same time every day.  -Avoid NSAIDs like ibuprofen (Advil, Motrin) and naproxen (Aleve) as well as aspirin doses over 100 mg daily. -Tylenol (acetaminophen) is the preferred over the counter pain medication to lower the risk of bleeding. -Be sure to alert all of your health care providers that you are taking an anticoagulant prior to starting a new medication or having a procedure. -Monitor for signs and symptoms of bleeding (abnormal bruising, prolonged bleeding, nose bleeds, bleeding from gums, discolored urine, black tarry stools). If you have fallen and hit your head OR if your bleeding is severe or not stopping, seek emergency care.  -Go to the emergency room if emergent signs and symptoms of new clot occur (new or worse swelling and pain in an arm or leg, shortness of breath, chest pain, fast or irregular heartbeats, lightheadedness, dizziness, fainting, coughing up blood) or if you experience a significant color change (pale or blue) in the extremity that has the DVT.   Your next visit is on Thursday, August 22nd at 2pm.  [facility] [address] [place], [place], [place] [address] Enter the hospital through Entrance C off [facility] and pull up to the Heart & Vascular Center entrance to the free valet parking.  Check in for your appointment at the Heart & Vascular Center.   If you have any questions or need to reschedule an appointment, please call [phone] [facility].  If you are having an emergency, call 911 or present to the nearest emergency room.   What is a DVT?  -Deep vein thrombosis (DVT) is a condition in which a blood clot forms in a vein of the deep  venous system which can occur in the lower leg, thigh, pelvis, arm, or neck. This condition is serious and can be life-threatening if the clot travels to the arteries of the lungs and causing a blockage (pulmonary embolism, PE). A DVT can also damage veins in the leg, which can lead to long-term venous disease, leg pain, swelling, discoloration, and ulcers or sores (post-thrombotic syndrome).  -Treatment may include taking an anticoagulant medication to prevent more clots from forming and the current clot from growing, wearing compression stockings, and/or surgical procedures to remove or dissolve the clot.

## 2023-05-25 NOTE — Progress Notes (Signed)
Patient ID: Ronald Massey, male    DOB: 1955-11-08  MRN: [id number]  CC: Diabetes (DM f/u. Med refills. /Swelling of R & L legs, experienced multiple falls during recent travel /)   Subjective: Ronald Massey is a 67 y.o. male who presents for chronic disease management.  PCP is Ronald Massey whom he last saw 02/2023 His concerns today include:  DM, HTN, HL  DM:   Results for orders placed or performed in visit on 05/25/23  POCT glucose (manual entry)  Result Value Ref Range   POC Glucose 144 (A) 70 - 99 mg/dl  POCT glycosylated hemoglobin (Hb A1C)  Result Value Ref Range   Hemoglobin A1C     HbA1c POC (<> result, manual entry)     HbA1c, POC (prediabetic range)     HbA1c, POC (controlled diabetic range) 7.4 (A) 0.0 - 7.0 %  on Trulicity 0.75 mg and Metformin 500mg  BID. Was in [place] for 1.5 mths; came back 3 wks ago.  Was not taking meds consistently while there Not checking BS but does have device Doing good with eating habits Does a lot of walking  HTN: on Norvasc 5 mg and Chlorthalidone 25 mg daily.  Was not taking consistently while traveling but has done so for the past 3 wks Reports LE edema BL after 2 day travel back to [place] from [place].  He keep legs elev and swelling dec.  Resolve in RLE but not completely in LT.  Some soreness in LT calf  HL:  taking and tolerating Lipitor  Reports residual cough from over 1 mth ago.  Started while in [place].  Saw MD there and placed on abx.  Almost resolve but dry cough in mornings.  No SOB or fever.  Patient Active Problem List   Diagnosis Date Noted   Left shoulder pain 02/27/2022   Hypopigmentation 02/27/2022   Type 2 diabetes mellitus (HCC) 08/07/2020   Hyperlipidemia associated with type 2 diabetes mellitus (HCC) 04/14/2017   Essential hypertension 09/16/2013   Primary open angle glaucoma 03/08/2010     Current Outpatient Medications on File Prior to Visit  Medication Sig Dispense Refill   amLODipine  (NORVASC) 5 MG tablet Take 1 tablet (5 mg total) by mouth daily. FOR BLOOD PRESSURE 90 tablet 1   Blood Glucose Monitoring Suppl (CONTOUR NEXT EZ) w/Device KIT USE AS DIRECTED TWICE DAILY 1 kit 0   Dulaglutide (TRULICITY) 0.75 MG/0.5ML SOPN Inject 0.75 mg into the skin once a week. Please fill as a 90 day supply 6 mL 3   loratadine (CLARITIN) 10 MG tablet Take 1 tablet (10 mg total) by mouth daily. 90 tablet 3   metFORMIN (GLUCOPHAGE) 500 MG tablet Take 1 tablet (500 mg total) by mouth 2 (two) times daily with a meal. 180 tablet 1   Microlet Lancets MISC Check blood sugar twice daily. Dx E11.8 100 each 2   Travoprost, BAK Free, (TRAVATAN Z) 0.004 % SOLN ophthalmic solution PLACE 1 DROP INTO BOTH EYES AT BEDTIME. 2.5 mL 1   Travoprost, BAK Free, (TRAVATAN Z) 0.004 % SOLN ophthalmic solution Place 1 drop into both eyes every evening as directed. 15 mL 4   No current facility-administered medications on file prior to visit.    No Known Allergies  Social History   Socioeconomic History   Marital status: Married    Spouse name: Not on file   Number of children: Not on file   Years of education: Not on file  Highest education level: Not on file  Occupational History   Not on file  Tobacco Use   Smoking status: Never   Smokeless tobacco: Never  Vaping Use   Vaping status: Never Used  Substance and Sexual Activity   Alcohol use: No   Drug use: No   Sexual activity: Not on file  Other Topics Concern   Not on file  Social History Narrative   Not on file   Social Determinants of Health   Financial Resource Strain: Low Risk  (01/16/2023)   Overall Financial Resource Strain (CARDIA)    Difficulty of Paying Living Expenses: Not hard at all  Food Insecurity: Food Insecurity Present (01/16/2023)   Hunger Vital Sign    Worried About Running Out of Food in the Last Year: Sometimes true    Ran Out of Food in the Last Year: Sometimes true  Transportation Needs: No Transportation Needs  (01/16/2023)   PRAPARE - Administrator, Civil Service (Medical): No    Lack of Transportation (Non-Medical): No  Physical Activity: Sufficiently Active (01/16/2023)   Exercise Vital Sign    Days of Exercise per Week: 7 days    Minutes of Exercise per Session: 30 min  Stress: No Stress Concern Present (01/16/2023)   [employer] of Occupational Health - Occupational Stress Questionnaire    Feeling of Stress : Not at all  Social Connections: Not on file  Intimate Partner Violence: Not on file    Family History  Problem Relation Age of Onset   Hypertension Mother     Past Surgical History:  Procedure Laterality Date   EYE SURGERY      ROS: Review of Systems Negative except as stated above  PHYSICAL EXAM: BP 130/68   Pulse 65   Temp (!) 97.5 F (36.4 C) (Oral)   Ht 5\' 8"  (1.727 m)   Wt 165 lb (74.8 kg)   SpO2 98%   BMI 25.09 kg/m   Physical Exam   General appearance - alert, well appearing, and in no distress Mental status - normal mood, behavior, speech, dress, motor activity, and thought processes Mouth - mucous membranes moist, pharynx normal without lesions Neck - supple, no significant adenopathy Chest - clear to auscultation, no wheezes, rales or rhonchi, symmetric air entry Heart - normal rate, regular rhythm, normal S1, S2, no murmurs, rubs, clicks or gallops Extremities - Trace LE RLE 1+ edema on LT     Latest Ref Rng & Units 02/16/2023    5:02 PM 09/09/2022    4:15 PM 06/13/2022    3:47 PM  CMP  Glucose 70 - 99 mg/dL 161  096    BUN 8 - 27 mg/dL 17  16    Creatinine 0.45 - 1.27 mg/dL 4.09  8.11    Sodium 914 - 144 mmol/L 141  141    Potassium 3.5 - 5.2 mmol/L 4.2  3.9  4.0   Chloride 96 - 106 mmol/L 102  98    CO2 20 - 29 mmol/L 24  27    Calcium 8.6 - 10.2 mg/dL 9.8  78.2    Total Protein 6.0 - 8.5 g/dL 7.4  7.7    Total Bilirubin 0.0 - 1.2 mg/dL 0.6  0.6    Alkaline Phos 44 - 121 IU/L 71  75    AST 0 - 40 IU/L 25  23    ALT 0 - 44  IU/L 31  29     Lipid Panel  Component Value Date/Time   CHOL 171 09/09/2022 1615   TRIG 92 09/09/2022 1615   HDL 57 09/09/2022 1615   CHOLHDL 3.0 09/09/2022 1615   CHOLHDL 4 02/27/2022 1448   VLDL 26.0 02/27/2022 1448   LDLCALC 97 09/09/2022 1615    CBC    Component Value Date/Time   WBC 5.2 06/02/2022 1445   WBC 4.2 05/26/2016 1617   RBC 4.73 06/02/2022 1445   RBC 4.62 05/26/2016 1617   HGB 14.7 06/02/2022 1445   HCT 42.5 06/02/2022 1445   PLT 197 06/02/2022 1445   MCV 90 06/02/2022 1445   MCH 31.1 06/02/2022 1445   MCH 30.5 05/26/2016 1617   MCHC 34.6 06/02/2022 1445   MCHC 34.1 05/26/2016 1617   RDW 13.0 06/02/2022 1445   LYMPHSABS 2.4 06/02/2022 1445   MONOABS 0.4 07/17/2010 2035   EOSABS 0.1 06/02/2022 1445   BASOSABS 0.0 06/02/2022 1445    ASSESSMENT AND PLAN: 1. Type 2 diabetes mellitus with hyperlipidemia (HCC) Close to goal.  Pt was not taking Trulicity and Metformin 500 mg BID consistently during his recent travels but has started doing so.  No changes made to meds.  Continue current meds Continue healthy eating and regular exercise - POCT glucose (manual entry) - POCT glycosylated hemoglobin (Hb A1C) - Comprehensive metabolic panel - atorvastatin (LIPITOR) 20 MG tablet; Take 1 tablet (20 mg total) by mouth daily.  Dispense: 90 tablet; Refill: 1  2. Hypertension associated with diabetes (HCC) At goal.  Continue Norvasc 5 mg and [place] 25 mg - chlorthalidone (HYGROTON) 25 MG tablet; Take 1 tablet (25 mg total) by mouth daily.  Dispense: 90 tablet; Refill: 1  3. Left leg swelling Sent for doppler US - VAS [place] LOWER EXTREMITY VENOUS (DVT); Future  4. Acute cough Residual from bacterial vs viral infection over 1 mth ago.  Told it can take several wks to resolve - benzonatate (TESSALON) 100 MG capsule; Take 1 capsule (100 mg total) by mouth 2 (two) times daily as needed for cough.  Dispense: 20 capsule; Refill: 0   Addendum:  call received from Greg  with [facility] this afternoon.  Pt positive for DVT in LT Peroneal Vein.  Advised to send pt to vascular/DVT clinic today.    Patient was given the opportunity to ask questions.  Patient verbalized understanding of the plan and was able to repeat key elements of the plan.   This documentation was completed using Paediatric nurse.  Any transcriptional errors are unintentional.  Orders Placed This Encounter  Procedures   Comprehensive metabolic panel   POCT glucose (manual entry)   POCT glycosylated hemoglobin (Hb A1C)   VAS [place] LOWER EXTREMITY VENOUS (DVT)     Requested Prescriptions   Signed Prescriptions Disp Refills   chlorthalidone (HYGROTON) 25 MG tablet 90 tablet 1    Sig: Take 1 tablet (25 mg total) by mouth daily.   benzonatate (TESSALON) 100 MG capsule 20 capsule 0    Sig: Take 1 capsule (100 mg total) by mouth 2 (two) times daily as needed for cough.   atorvastatin (LIPITOR) 20 MG tablet 90 tablet 1    Sig: Take 1 tablet (20 mg total) by mouth daily.    Return in about 3 weeks (around 06/15/2023) for Follow up with Rose Fillers.  Jonah Blue, MD, FACP

## 2023-05-25 NOTE — Progress Notes (Signed)
DVT Clinic Note  Name: Ronald Massey     MRN: [id number]     DOB: 1956/04/27     Sex: male  PCP: Claiborne Rigg, NP  Today's Visit: Visit Information: Initial Visit  Referred to DVT Clinic by: Dr. Laural Benes [facility])  Referred to CPP by: Dr. Randie Heinz Reason for referral:  Chief Complaint  Patient presents with   DVT   HISTORY OF PRESENT ILLNESS: Ronald Massey is a 67 y.o. male with PMH HTN, HLD, T2DM, who presents after diagnosis of DVT for medication management. Three weeks ago, the patient returned from [place] which he says was a 23 hour plane ride. He has been experiencing swelling in his left leg and was found today to have an acute DVT involving the left peroneal vein. He was referred to the DVT Clinic to start treatment. He has no prior history of VTE. Swelling is his only symptom. No pain, cramping. No SOB, CP, tachycardia, palpitations. Patient works as a Media planner and has rides periodically throughout the day but is active between jobs.   Positive Thrombotic Risk Factors: Sedentary journey lasting >8 hours within 4 weeks Bleeding Risk Factors: Age >65 years  Negative Thrombotic Risk Factors: Previous VTE, Recent surgery (within 3 months), Recent trauma (within 3 months), Recent admission to hospital with acute illness (within 3 months), Paralysis, paresis, or recent plaster cast immobilization of lower extremity, Bed rest >72 hours within 3 months, Central venous catheterization, Pregnancy, Within 6 weeks postpartum, Recent cesarean section (within 3 months), Estrogen therapy, Testosterone therapy, Erythropoiesis-stimulating agent, Recent COVID diagnosis (within 3 months), Active cancer, Non-malignant, chronic inflammatory condition, Known thrombophilic condition, Smoking, Obesity, Older age  Rx Insurance Coverage:  Medicaid Medicare Rx Affordability: Eliquis is $0 on his insurance Preferred Pharmacy: Filled starter pack during visit today at [facility]. Refills sent to [facility] at [facility].   Past Medical History:  Diagnosis Date   Dental cavities 12/25/2014   Diabetes mellitus without complication (HCC)    Hyperlipidemia    Hypertension    Onychomycosis of toenail 03/04/2016   Pollen allergies    Tinea pedis of both feet 03/04/2016    Past Surgical History:  Procedure Laterality Date   EYE SURGERY      Social History   Socioeconomic History   Marital status: Married    Spouse name: Not on file   Number of children: Not on file   Years of education: Not on file   Highest education level: Not on file  Occupational History   Not on file  Tobacco Use   Smoking status: Never   Smokeless tobacco: Never  Vaping Use   Vaping status: Never Used  Substance and Sexual Activity   Alcohol use: No   Drug use: No   Sexual activity: Not on file  Other Topics Concern   Not on file  Social History Narrative   Not on file   Social Determinants of Health   Financial Resource Strain: Low Risk  (01/16/2023)   Overall Financial Resource Strain (CARDIA)    Difficulty of Paying Living Expenses: Not hard at all  Food Insecurity: Food Insecurity Present (01/16/2023)   Hunger Vital Sign    Worried About Running Out of Food in the Last Year: Sometimes true    Ran Out of Food in the Last Year: Sometimes true  Transportation Needs: No Transportation Needs (01/16/2023)   PRAPARE - Transportation  Lack of Transportation (Medical): No    Lack of Transportation (Non-Medical): No  Physical Activity: Sufficiently Active (01/16/2023)   Exercise Vital Sign    Days of Exercise per Week: 7 days    Minutes of Exercise per Session: 30 min  Stress: No Stress Concern Present (01/16/2023)   [employer] of Occupational Health - Occupational Stress Questionnaire    Feeling of Stress : Not at all  Social Connections: Not on file  Intimate Partner Violence: Not on file    Family History  Problem  Relation Age of Onset   Hypertension Mother     Allergies as of 05/25/2023   (No Known Allergies)    Current Outpatient Medications on File Prior to Encounter  Medication Sig Dispense Refill   amLODipine (NORVASC) 5 MG tablet Take 1 tablet (5 mg total) by mouth daily. FOR BLOOD PRESSURE 90 tablet 1   atorvastatin (LIPITOR) 20 MG tablet Take 1 tablet (20 mg total) by mouth daily. 90 tablet 1   benzonatate (TESSALON) 100 MG capsule Take 1 capsule (100 mg total) by mouth 2 (two) times daily as needed for cough. 20 capsule 0   chlorthalidone (HYGROTON) 25 MG tablet Take 1 tablet (25 mg total) by mouth daily. 90 tablet 1   Dulaglutide (TRULICITY) 0.75 MG/0.5ML SOPN Inject 0.75 mg into the skin once a week. Please fill as a 90 day supply 6 mL 3   loratadine (CLARITIN) 10 MG tablet Take 1 tablet (10 mg total) by mouth daily. 90 tablet 3   metFORMIN (GLUCOPHAGE) 500 MG tablet Take 1 tablet (500 mg total) by mouth 2 (two) times daily with a meal. 180 tablet 1   Travoprost, BAK Free, (TRAVATAN Z) 0.004 % SOLN ophthalmic solution PLACE 1 DROP INTO BOTH EYES AT BEDTIME. 2.5 mL 1   Travoprost, BAK Free, (TRAVATAN Z) 0.004 % SOLN ophthalmic solution Place 1 drop into both eyes every evening as directed. 15 mL 4   Blood Glucose Monitoring Suppl (CONTOUR NEXT EZ) w/Device KIT USE AS DIRECTED TWICE DAILY 1 kit 0   Microlet Lancets MISC Check blood sugar twice daily. Dx E11.8 100 each 2   No current facility-administered medications on file prior to encounter.   REVIEW OF SYSTEMS:  Review of Systems  Respiratory:  Negative for shortness of breath.   Cardiovascular:  Positive for leg swelling. Negative for chest pain and palpitations.  Musculoskeletal:  Negative for myalgias.  Neurological:  Negative for dizziness and tingling.   PHYSICAL EXAMINATION:  Vitals:   05/25/23 1601  BP: 139/78  Pulse: 84  SpO2: 100%   Physical Exam Vitals reviewed.  Cardiovascular:     Rate and Rhythm: Normal rate.   Pulmonary:     Effort: Pulmonary effort is normal.  Musculoskeletal:        General: No tenderness.     Right lower leg: Edema (trace) present.     Left lower leg: Edema (1+) present.  Skin:    Findings: No bruising or erythema.  Psychiatric:        Mood and Affect: Mood normal.        Behavior: Behavior normal.        Thought Content: Thought content normal.   Villalta Score for Post-Thrombotic Syndrome: Pain: Absent Cramps: Absent Heaviness: Absent Paresthesia: Absent Pruritus: Absent Pretibial Edema: Mild Skin Induration: Absent Hyperpigmentation: Absent Redness: Absent Venous Ectasia: Absent Pain on calf compression: Absent Villalta Preliminary Score: 1 Is venous ulcer present?: No If venous ulcer is present and  score is <15, then 15 points total are assigned: Absent Villalta Total Score: 1  LABS:  CBC     Component Value Date/Time   WBC 5.2 06/02/2022 1445   WBC 4.2 05/26/2016 1617   RBC 4.73 06/02/2022 1445   RBC 4.62 05/26/2016 1617   HGB 14.7 06/02/2022 1445   HCT 42.5 06/02/2022 1445   PLT 197 06/02/2022 1445   MCV 90 06/02/2022 1445   MCH 31.1 06/02/2022 1445   MCH 30.5 05/26/2016 1617   MCHC 34.6 06/02/2022 1445   MCHC 34.1 05/26/2016 1617   RDW 13.0 06/02/2022 1445   LYMPHSABS 2.4 06/02/2022 1445   MONOABS 0.4 07/17/2010 2035   EOSABS 0.1 06/02/2022 1445   BASOSABS 0.0 06/02/2022 1445    Hepatic Function      Component Value Date/Time   PROT 7.4 02/16/2023 1702   ALBUMIN 4.5 02/16/2023 1702   AST 25 02/16/2023 1702   ALT 31 02/16/2023 1702   ALKPHOS 71 02/16/2023 1702   BILITOT 0.6 02/16/2023 1702    Renal Function   Lab Results  Component Value Date   CREATININE 1.11 02/16/2023   CREATININE 1.18 09/09/2022   CREATININE 0.91 06/02/2022    CrCl cannot be calculated (Patient's most recent lab result is older than the maximum 21 days allowed.).   [facility] Vascular Lab Studies:  05/25/23 VAS [place] LOWER EXTREMITY VENOUS LEFT (DVT)  Summary:   RIGHT:  - No evidence of common femoral vein obstruction.    LEFT:  - Findings consistent with acute deep vein thrombosis involving the left  peroneal veins.  - No cystic structure found in the popliteal fossa.   ASSESSMENT: Location of DVT: Left distal vein Cause of DVT: provoked by a transient risk factor - recent prolonged travel from [place]. Will treat first provoked DVT with anticoagulation for 3 months. Labs are appropriate for Eliquis start. Counseled patient extensively on the medication. This was filled during his visit today and refills have been sent to his preferred pharmacy. Copay is $0 so no cost issues impacting adherence at this time. His primary symptom is swelling. We discussed the importance of elevation and compression to help with this.   PLAN: -Start apixaban (Eliquis) 10 mg twice daily for 7 days followed by 5 mg twice daily. -Expected duration of therapy: 3 months. Therapy started on 05/25/23. -Patient educated on purpose, proper use and potential adverse effects of apixaban (Eliquis). -Discussed importance of taking medication around the same time every day. -Advised patient of medications to avoid (NSAIDs, aspirin doses >100 mg daily). -Educated that Tylenol (acetaminophen) is the preferred analgesic to lower the risk of bleeding. -Advised patient to alert all providers of anticoagulation therapy prior to starting a new medication or having a procedure. -Emphasized importance of monitoring for signs and symptoms of bleeding (abnormal bruising, prolonged bleeding, nose bleeds, bleeding from gums, discolored urine, black tarry stools). -Educated patient to present to the ED if emergent signs and symptoms of new thrombosis occur. -Counseled patient to wear compression stockings daily, removing at night. Encouraged elevation as well to help improve his swelling.   Follow up: 1 month in DVT Clinic to ensure adherence  Pervis Hocking, PharmD, BCACP, CPP Deep Vein  Thrombosis Clinic Clinical Pharmacist Practitioner Office: [phone]

## 2023-05-27 ENCOUNTER — Telehealth: Payer: Self-pay | Admitting: Internal Medicine

## 2023-05-27 NOTE — Telephone Encounter (Signed)
Phone call placed to patient today to go over the results of the Doppler ultrasound of his left leg and to make sure he is tolerating the Eliquis.  Call automatically went to an automated voicemail.  I did not leave a message. Patient was seen by me 2 days ago with swelling in the left leg after prolonged travel via plane.  Doppler ultrasound came back positive for DVT.  I had patient seen in vascular clinic the same day.  He was started on Eliquis.

## 2023-05-29 ENCOUNTER — Other Ambulatory Visit: Payer: Self-pay

## 2023-06-09 ENCOUNTER — Ambulatory Visit: Payer: Medicare Other | Attending: Nurse Practitioner

## 2023-06-10 ENCOUNTER — Other Ambulatory Visit: Payer: Self-pay

## 2023-06-10 MED ORDER — TRAVOPROST (BAK FREE) 0.004 % OP SOLN
1.0000 [drp] | Freq: Every evening | OPHTHALMIC | 4 refills | Status: AC
  Filled 2023-06-10: qty 2.5, 25d supply, fill #0
  Filled 2023-06-29: qty 2.5, 50d supply, fill #0
  Filled 2023-10-23: qty 2.5, 50d supply, fill #1
  Filled 2023-11-18 – 2024-02-26 (×2): qty 2.5, 50d supply, fill #2

## 2023-06-11 ENCOUNTER — Other Ambulatory Visit: Payer: Self-pay

## 2023-06-16 NOTE — Progress Notes (Unsigned)
    Aleen Sells D.[facility] [address] [place] [address] Phone: [phone]   Assessment and Plan:     There are no diagnoses linked to this encounter.  ***   Pertinent previous records reviewed include ***   Follow Up: ***     Subjective:   I, Ronald Massey, am serving as a Neurosurgeon for Doctor Richardean Sale   Chief Complaint: left knee pain    HPI:    07/04/2022 Patient is a 67 year old male complaining of left knee pain. Patient states that he is becoming , he is muslim so when he prays it hurts or when he force flexes , hasnt been hurting for that long   06/17/2023 Patient states    Relevant Historical Information: DM type II, hypertension  Additional pertinent review of systems negative.   Current Outpatient Medications:    amLODipine (NORVASC) 5 MG tablet, Take 1 tablet (5 mg total) by mouth daily. FOR BLOOD PRESSURE, Disp: 90 tablet, Rfl: 1   apixaban (ELIQUIS) 5 MG TABS tablet, Take 1 tablet (5 mg total) by mouth 2 (two) times daily. Start taking after completion of starter pack., Disp: 60 tablet, Rfl: 1   APIXABAN (ELIQUIS) VTE STARTER PACK (10MG  AND 5MG ), Take as directed on package: start with two-5mg  tablets twice daily for 7 days. On day 8, switch to one-5mg  tablet twice daily., Disp: 74 each, Rfl: 0   atorvastatin (LIPITOR) 20 MG tablet, Take 1 tablet (20 mg total) by mouth daily., Disp: 90 tablet, Rfl: 1   benzonatate (TESSALON) 100 MG capsule, Take 1 capsule (100 mg total) by mouth 2 (two) times daily as needed for cough., Disp: 20 capsule, Rfl: 0   Blood Glucose Monitoring Suppl (CONTOUR NEXT EZ) w/Device KIT, USE AS DIRECTED TWICE DAILY, Disp: 1 kit, Rfl: 0   chlorthalidone (HYGROTON) 25 MG tablet, Take 1 tablet (25 mg total) by mouth daily., Disp: 90 tablet, Rfl: 1   Dulaglutide (TRULICITY) 0.75 MG/0.5ML SOPN, Inject 0.75 mg into the skin once a week. Please fill as a 90 day supply, Disp: 6 mL, Rfl: 3    loratadine (CLARITIN) 10 MG tablet, Take 1 tablet (10 mg total) by mouth daily., Disp: 90 tablet, Rfl: 3   metFORMIN (GLUCOPHAGE) 500 MG tablet, Take 1 tablet (500 mg total) by mouth 2 (two) times daily with a meal., Disp: 180 tablet, Rfl: 1   Microlet Lancets MISC, Check blood sugar twice daily. Dx E11.8, Disp: 100 each, Rfl: 2   Travoprost, BAK Free, (TRAVATAN Z) 0.004 % SOLN ophthalmic solution, PLACE 1 DROP INTO BOTH EYES AT BEDTIME., Disp: 2.5 mL, Rfl: 1   Travoprost, BAK Free, (TRAVATAN Z) 0.004 % SOLN ophthalmic solution, Place 1 drop into both eyes every evening as directed., Disp: 15 mL, Rfl: 4   Travoprost, BAK Free, (TRAVATAN Z) 0.004 % SOLN ophthalmic solution, Place 1 drop into both eyes every evening as directed., Disp: 15 mL, Rfl: 4   Objective:     There were no vitals filed for this visit.    There is no height or weight on file to calculate BMI.    Physical Exam:    ***   Electronically signed by:  Aleen Sells D.[facility] 4:41 PM 06/16/23

## 2023-06-17 ENCOUNTER — Other Ambulatory Visit: Payer: Self-pay

## 2023-06-17 ENCOUNTER — Ambulatory Visit (INDEPENDENT_AMBULATORY_CARE_PROVIDER_SITE_OTHER): Payer: Medicare Other

## 2023-06-17 ENCOUNTER — Ambulatory Visit (INDEPENDENT_AMBULATORY_CARE_PROVIDER_SITE_OTHER): Payer: Medicare Other | Admitting: Sports Medicine

## 2023-06-17 VITALS — HR 72 | Ht 68.0 in | Wt 170.0 lb

## 2023-06-17 DIAGNOSIS — G8929 Other chronic pain: Secondary | ICD-10-CM | POA: Diagnosis not present

## 2023-06-17 DIAGNOSIS — M25561 Pain in right knee: Secondary | ICD-10-CM | POA: Diagnosis not present

## 2023-06-17 DIAGNOSIS — M1711 Unilateral primary osteoarthritis, right knee: Secondary | ICD-10-CM | POA: Diagnosis not present

## 2023-06-17 NOTE — Patient Instructions (Signed)
Knee HEP  Tylenol for pain relief  4 week follow up

## 2023-06-22 ENCOUNTER — Ambulatory Visit: Payer: 59 | Admitting: Nurse Practitioner

## 2023-06-25 ENCOUNTER — Ambulatory Visit (HOSPITAL_COMMUNITY)
Admission: RE | Admit: 2023-06-25 | Discharge: 2023-06-25 | Disposition: A | Discharge status: Home / Self Care | Payer: BLUE CROSS/BLUE SHIELD | Source: Ambulatory Visit | Attending: Surgery | Admitting: Surgery

## 2023-06-25 ENCOUNTER — Encounter (HOSPITAL_COMMUNITY): Payer: Self-pay | Admitting: Student-PharmD

## 2023-06-25 ENCOUNTER — Other Ambulatory Visit: Payer: Self-pay

## 2023-06-25 ENCOUNTER — Other Ambulatory Visit (HOSPITAL_COMMUNITY): Payer: Self-pay

## 2023-06-25 VITALS — BP 143/70 | HR 75

## 2023-06-25 DIAGNOSIS — I82452 Acute embolism and thrombosis of left peroneal vein: Secondary | ICD-10-CM

## 2023-06-25 NOTE — Patient Instructions (Signed)
-  Continue apixaban (Eliquis) 5 mg (1 tablet) twice daily for 3 months total.  -Your refills have been sent to [facility] for you to pick up today.  -It is important to take your medication around the same time every day.  -Avoid NSAIDs like ibuprofen (Advil, Motrin) and naproxen (Aleve) as well as aspirin doses over 100 mg daily. -Tylenol (acetaminophen) is the preferred over the counter pain medication to lower the risk of bleeding. -Be sure to alert all of your health care providers that you are taking an anticoagulant prior to starting a new medication or having a procedure. -Monitor for signs and symptoms of bleeding (abnormal bruising, prolonged bleeding, nose bleeds, bleeding from gums, discolored urine, black tarry stools). If you have fallen and hit your head OR if your bleeding is severe or not stopping, seek emergency care.  -Go to the emergency room if emergent signs and symptoms of new clot occur (new or worse swelling and pain in an arm or leg, shortness of breath, chest pain, fast or irregular heartbeats, lightheadedness, dizziness, fainting, coughing up blood) or if you experience a significant color change (pale or blue) in the extremity that has the DVT.   Your next visit is on Tuesday, October 22nd at 2:30pm.  [facility] [address] [place], [place], [place] [address] Enter the hospital through Entrance C off [facility] and pull up to the Heart & Vascular Center entrance to the free valet parking.  Check in for your appointment at the Heart & Vascular Center.   If you have any questions or need to reschedule an appointment, please call [phone] [facility].  If you are having an emergency, call 911 or present to the nearest emergency room.   What is a DVT?  -Deep vein thrombosis (DVT) is a condition in which a blood clot forms in a vein of the deep venous system which can occur in the lower leg, thigh, pelvis, arm, or neck. This condition  is serious and can be life-threatening if the clot travels to the arteries of the lungs and causing a blockage (pulmonary embolism, PE). A DVT can also damage veins in the leg, which can lead to long-term venous disease, leg pain, swelling, discoloration, and ulcers or sores (post-thrombotic syndrome).  -Treatment may include taking an anticoagulant medication to prevent more clots from forming and the current clot from growing, wearing compression stockings, and/or surgical procedures to remove or dissolve the clot.

## 2023-06-25 NOTE — Progress Notes (Addendum)
DVT Clinic Note  Name: Ronald Massey     MRN: [id number]     DOB: 03-31-1956     Sex: male  PCP: Claiborne Rigg, NP  Today's Visit: Visit Information: Follow Up Visit  Referred to DVT Clinic by: Primary Care - Dr. Laural Benes [facility]) Referred to CPP by: Dr. Myra Gianotti Reason for referral:  Chief Complaint  Patient presents with   Med Management - DVT   HISTORY OF PRESENT ILLNESS: Ronald Massey is a 67 y.o. male with PMH HTN, HLD, T2DM, who presents for follow up medication management after diagnosis of acute DVT involving the left peroneal veins on 05/25/23. Three weeks prior, the patient returned from [place] on a 23 hour plane ride after once per lifetime pilgrimage to [place] and began to have LLE swelling. Last seen in DVT Clinic 05/25/23 at which time Eliquis was started. He had no prior history of VTE.   Today patient reports that within the first week of starting Eliquis last month, his swelling resolved. Denies abnormal bleeding or bruising. Endorses at least 2 missed doses of Eliquis. Patient works as a Media planner and has rides periodically throughout the day but is active between jobs. He tells me he has been walking for 2 hours per day without issue. He asks if he still needs to take Eliquis now that he is feeling back to normal.   Positive Thrombotic Risk Factors: Sedentary journey lasting >8 hours within 4 weeks Bleeding Risk Factors: Age >65 years, Anticoagulant therapy  Negative Thrombotic Risk Factors: Previous VTE, Recent surgery (within 3 months), Recent trauma (within 3 months), Recent admission to hospital with acute illness (within 3 months), Paralysis, paresis, or recent plaster cast immobilization of lower extremity, Central venous catheterization, Bed rest >72 hours within 3 months, Pregnancy, Within 6 weeks postpartum, Recent cesarean section (within 3 months), Estrogen therapy, Testosterone therapy, Erythropoiesis-stimulating agent, Recent COVID  diagnosis (within 3 months), Active cancer, Non-malignant, chronic inflammatory condition, Known thrombophilic condition, Smoking, Obesity, Older age  Rx Insurance Coverage:  Medicaid Medicare Rx Affordability: Eliquis is $0 on his insurance  Preferred Pharmacy: Filled starter pack during initial DVT Clinic visit at [facility]. Refills sent to [facility] at [facility].   Past Medical History:  Diagnosis Date   Dental cavities 12/25/2014   Diabetes mellitus without complication (HCC)    Hyperlipidemia    Hypertension    Onychomycosis of toenail 03/04/2016   Pollen allergies    Tinea pedis of both feet 03/04/2016    Past Surgical History:  Procedure Laterality Date   EYE SURGERY      Social History   Socioeconomic History   Marital status: Married    Spouse name: Not on file   Number of children: Not on file   Years of education: Not on file   Highest education level: Not on file  Occupational History   Not on file  Tobacco Use   Smoking status: Never   Smokeless tobacco: Never  Vaping Use   Vaping status: Never Used  Substance and Sexual Activity   Alcohol use: No   Drug use: No   Sexual activity: Not on file  Other Topics Concern   Not on file  Social History Narrative   Not on file   Social Determinants of Health   Financial Resource Strain: Low Risk  (01/16/2023)   Overall Financial Resource Strain (CARDIA)    Difficulty of Paying Living Expenses:  Not hard at all  Food Insecurity: Food Insecurity Present (01/16/2023)   Hunger Vital Sign    Worried About Running Out of Food in the Last Year: Sometimes true    Ran Out of Food in the Last Year: Sometimes true  Transportation Needs: No Transportation Needs (01/16/2023)   PRAPARE - Administrator, Civil Service (Medical): No    Lack of Transportation (Non-Medical): No  Physical Activity: Sufficiently Active (01/16/2023)   Exercise Vital Sign    Days of Exercise  per Week: 7 days    Minutes of Exercise per Session: 30 min  Stress: No Stress Concern Present (01/16/2023)   [employer] of Occupational Health - Occupational Stress Questionnaire    Feeling of Stress : Not at all  Social Connections: Not on file  Intimate Partner Violence: Not on file    Family History  Problem Relation Age of Onset   Hypertension Mother     Allergies as of 06/25/2023   (No Known Allergies)    Current Outpatient Medications on File Prior to Encounter  Medication Sig Dispense Refill   APIXABAN (ELIQUIS) VTE STARTER PACK (10MG  AND 5MG ) Take as directed on package: start with two-5mg  tablets twice daily for 7 days. On day 8, switch to one-5mg  tablet twice daily. 74 each 0   amLODipine (NORVASC) 5 MG tablet Take 1 tablet (5 mg total) by mouth daily. FOR BLOOD PRESSURE 90 tablet 1   apixaban (ELIQUIS) 5 MG TABS tablet Take 1 tablet (5 mg total) by mouth 2 (two) times daily. Start taking after completion of starter pack. 60 tablet 1   atorvastatin (LIPITOR) 20 MG tablet Take 1 tablet (20 mg total) by mouth daily. 90 tablet 1   benzonatate (TESSALON) 100 MG capsule Take 1 capsule (100 mg total) by mouth 2 (two) times daily as needed for cough. 20 capsule 0   Blood Glucose Monitoring Suppl (CONTOUR NEXT EZ) w/Device KIT USE AS DIRECTED TWICE DAILY 1 kit 0   chlorthalidone (HYGROTON) 25 MG tablet Take 1 tablet (25 mg total) by mouth daily. 90 tablet 1   Dulaglutide (TRULICITY) 0.75 MG/0.5ML SOPN Inject 0.75 mg into the skin once a week. Please fill as a 90 day supply 6 mL 3   loratadine (CLARITIN) 10 MG tablet Take 1 tablet (10 mg total) by mouth daily. 90 tablet 3   metFORMIN (GLUCOPHAGE) 500 MG tablet Take 1 tablet (500 mg total) by mouth 2 (two) times daily with a meal. 180 tablet 1   Microlet Lancets MISC Check blood sugar twice daily. Dx E11.8 100 each 2   Travoprost, BAK Free, (TRAVATAN Z) 0.004 % SOLN ophthalmic solution PLACE 1 DROP INTO BOTH EYES AT BEDTIME. 2.5 mL  1   Travoprost, BAK Free, (TRAVATAN Z) 0.004 % SOLN ophthalmic solution Place 1 drop into both eyes every evening as directed. 15 mL 4   Travoprost, BAK Free, (TRAVATAN Z) 0.004 % SOLN ophthalmic solution Place 1 drop into both eyes every evening as directed. 15 mL 4   No current facility-administered medications on file prior to encounter.   REVIEW OF SYSTEMS:  Review of Systems  Respiratory:  Negative for shortness of breath.   Cardiovascular:  Negative for chest pain, palpitations and leg swelling.  Musculoskeletal:  Negative for myalgias.  Neurological:  Negative for dizziness and tingling.   PHYSICAL EXAMINATION:  Vitals:   06/25/23 1422 06/25/23 1430  BP: (!) 150/73 (!) 143/70  Pulse: 75   SpO2: 98%  Physical Exam Vitals reviewed.  Cardiovascular:     Rate and Rhythm: Normal rate.  Pulmonary:     Effort: Pulmonary effort is normal.  Musculoskeletal:        General: No swelling or tenderness.  Skin:    Findings: No bruising or erythema.  Psychiatric:        Mood and Affect: Mood normal.        Behavior: Behavior normal.        Thought Content: Thought content normal.   Villalta Score for Post-Thrombotic Syndrome: Pain: Absent Cramps: Absent Heaviness: Absent Paresthesia: Absent Pruritus: Absent Pretibial Edema: Absent Skin Induration: Absent Hyperpigmentation: Absent Redness: Absent Venous Ectasia: Absent Pain on calf compression: Absent Villalta Preliminary Score: 0 Is venous ulcer present?: No If venous ulcer is present and score is <15, then 15 points total are assigned: Absent Villalta Total Score: 0  LABS:  CBC     Component Value Date/Time   WBC 5.2 06/02/2022 1445   WBC 4.2 05/26/2016 1617   RBC 4.73 06/02/2022 1445   RBC 4.62 05/26/2016 1617   HGB 14.7 06/02/2022 1445   HCT 42.5 06/02/2022 1445   PLT 197 06/02/2022 1445   MCV 90 06/02/2022 1445   MCH 31.1 06/02/2022 1445   MCH 30.5 05/26/2016 1617   MCHC 34.6 06/02/2022 1445   MCHC  34.1 05/26/2016 1617   RDW 13.0 06/02/2022 1445   LYMPHSABS 2.4 06/02/2022 1445   MONOABS 0.4 07/17/2010 2035   EOSABS 0.1 06/02/2022 1445   BASOSABS 0.0 06/02/2022 1445    Hepatic Function      Component Value Date/Time   PROT 6.8 06/09/2023 1544   ALBUMIN 4.2 06/09/2023 1544   AST 20 06/09/2023 1544   ALT 20 06/09/2023 1544   ALKPHOS 68 06/09/2023 1544   BILITOT <0.2 06/09/2023 1544    Renal Function   Lab Results  Component Value Date   CREATININE 0.93 06/09/2023   CREATININE 1.11 02/16/2023   CREATININE 1.18 09/09/2022    Estimated Creatinine Clearance: 75.6 mL/min (by C-G formula based on SCr of 0.93 mg/dL).   [facility] Vascular Lab Studies:  05/25/23 VAS [place] LOWER EXTREMITY VENOUS LEFT (DVT)  Summary:  RIGHT:  - No evidence of common femoral vein obstruction.    LEFT:  - Findings consistent with acute deep vein thrombosis involving the left  peroneal veins.  - No cystic structure found in the popliteal fossa.   ASSESSMENT: Location of DVT: Left distal vein Cause of DVT: provoked by a transient risk factor - 23 hour plane ride from [place]. Plan is to treat first provoked DVT with anticoagulation for 3 months. He is tolerating Eliquis well with no bruising or bleeding. His LLE swelling has resolved. He has missed a few doses of Eliquis and asks today if he needs to keep taking it if he is feeling better. I explained the importance and reasoning behind taking Eliquis for a total of 3 months. He expressed understanding and said he will take it as prescribed. He has not yet picked up refills of Eliquis so I worked with his pharmacy to start the fill process for him to pick up remaining course of treatment today.   PLAN: -Continue apixaban (Eliquis) 5 mg twice daily. -Expected duration of therapy: 3 months. Therapy started on 05/25/23. -Patient educated on purpose, proper use and potential adverse effects of apixaban (Eliquis). -Discussed importance of taking medication  around the same time every day. -Advised patient of medications to avoid (NSAIDs, aspirin  doses >100 mg daily). -Educated that Tylenol (acetaminophen) is the preferred analgesic to lower the risk of bleeding. -Advised patient to alert all providers of anticoagulation therapy prior to starting a new medication or having a procedure. -Emphasized importance of monitoring for signs and symptoms of bleeding (abnormal bruising, prolonged bleeding, nose bleeds, bleeding from gums, discolored urine, black tarry stools). -Educated patient to present to the ED if emergent signs and symptoms of new thrombosis occur.  Follow up: 2 months at end of treatment  Pervis Hocking, PharmD, [place], CPP Deep Vein Thrombosis Clinic Clinical Pharmacist Practitioner Office: [phone]

## 2023-06-26 ENCOUNTER — Other Ambulatory Visit (HOSPITAL_COMMUNITY): Payer: Self-pay

## 2023-06-29 ENCOUNTER — Other Ambulatory Visit: Payer: Self-pay

## 2023-07-07 ENCOUNTER — Other Ambulatory Visit: Payer: Self-pay

## 2023-07-07 ENCOUNTER — Ambulatory Visit (INDEPENDENT_AMBULATORY_CARE_PROVIDER_SITE_OTHER): Payer: Medicare Other | Admitting: Sports Medicine

## 2023-07-07 VITALS — Ht 68.0 in | Wt 167.0 lb

## 2023-07-07 DIAGNOSIS — G8929 Other chronic pain: Secondary | ICD-10-CM | POA: Diagnosis not present

## 2023-07-07 DIAGNOSIS — M1711 Unilateral primary osteoarthritis, right knee: Secondary | ICD-10-CM | POA: Diagnosis not present

## 2023-07-07 DIAGNOSIS — M25561 Pain in right knee: Secondary | ICD-10-CM

## 2023-07-07 MED ORDER — METHYLPREDNISOLONE 4 MG PO TBPK
ORAL_TABLET | ORAL | 0 refills | Status: AC
  Filled 2023-07-07: qty 21, 6d supply, fill #0

## 2023-07-07 NOTE — Patient Instructions (Signed)
Prednisone dos pak  Zilretta auth  1 week follow up

## 2023-07-07 NOTE — Progress Notes (Signed)
Ronald Massey D.[facility] [address] [place] [address] Phone: [phone]   Assessment and Plan:     1. Chronic pain of right knee 2. Primary osteoarthritis of right knee -Chronic with exacerbation, subsequent visit - Consistent with recurrent flare of osteoarthritis. - Patient states that he got 1 to 2 weeks relief after intra-articular CSI previous office visit on 06/17/2023, however his pain has returned and swelling is returned since that time - Patient is still taking Eliquis for DVT found on scan on 05/25/2023 with planning 71-month course of Eliquis - Do not recommend NSAID use while on Eliquis - May start prednisone Dosepak.  Prescription sent in - Patient did not start HEP for knee.  Recommend starting HEP for knee. - Since patient did not receive significant benefit from injection for at least 3 months, we will try Zilretta injection.  Ordered today for prior authorization and we can perform injection at 1 week follow-up - I provided patient with a 64-month handicap placard while we are ongoing treatment for his knee  Other orders - methylPREDNISolone (MEDROL DOSEPAK) 4 MG TBPK tablet; Take 6 tablets on day 1.  Take 5 tablets on day 2.  Take 4 tablets on day 3.  Take 3 tablets on day 4.  Take 2 tablets on day 5.  Take 1 tablet on day 6.    Pertinent previous records reviewed include pharmacy note 06/25/2023   Follow Up: 1 week for procedure only visit Zilretta injection   Subjective:   I, Ronald Massey, am serving as a Neurosurgeon for Doctor Richardean Sale   Chief Complaint: left knee pain    HPI:    07/04/2022 Patient is a 67 year old male complaining of left knee pain. Patient states that he is becoming , he is muslim so when he prays it hurts or when he force flexes , hasnt been hurting for that long    06/17/2023 Patient states that he now has right knee pain . Pain is quad. Has pain when he is flexed. No meds for the pain. No  numbness or tingling. He walks about 40 miles    07/07/2023 Patient states right knee is very bad    Relevant Historical Information: DM type II, hypertension  Additional pertinent review of systems negative.   Current Outpatient Medications:    amLODipine (NORVASC) 5 MG tablet, Take 1 tablet (5 mg total) by mouth daily. FOR BLOOD PRESSURE, Disp: 90 tablet, Rfl: 1   apixaban (ELIQUIS) 5 MG TABS tablet, Take 1 tablet (5 mg total) by mouth 2 (two) times daily. Start taking after completion of starter pack., Disp: 60 tablet, Rfl: 1   APIXABAN (ELIQUIS) VTE STARTER PACK (10MG  AND 5MG ), Take as directed on package: start with two-5mg  tablets twice daily for 7 days. On day 8, switch to one-5mg  tablet twice daily., Disp: 74 each, Rfl: 0   atorvastatin (LIPITOR) 20 MG tablet, Take 1 tablet (20 mg total) by mouth daily., Disp: 90 tablet, Rfl: 1   benzonatate (TESSALON) 100 MG capsule, Take 1 capsule (100 mg total) by mouth 2 (two) times daily as needed for cough., Disp: 20 capsule, Rfl: 0   Blood Glucose Monitoring Suppl (CONTOUR NEXT EZ) w/Device KIT, USE AS DIRECTED TWICE DAILY, Disp: 1 kit, Rfl: 0   chlorthalidone (HYGROTON) 25 MG tablet, Take 1 tablet (25 mg total) by mouth daily., Disp: 90 tablet, Rfl: 1   Dulaglutide (TRULICITY) 0.75 MG/0.5ML SOPN, Inject 0.75 mg into  the skin once a week. Please fill as a 90 day supply, Disp: 6 mL, Rfl: 3   loratadine (CLARITIN) 10 MG tablet, Take 1 tablet (10 mg total) by mouth daily., Disp: 90 tablet, Rfl: 3   methylPREDNISolone (MEDROL DOSEPAK) 4 MG TBPK tablet, Take 6 tablets (24 mg total) by mouth daily for 1 day, THEN 5 tablets (20 mg total) daily for 1 day, THEN 4 tablets (16 mg total) daily for 1 day, THEN 3 tablets (12 mg total) daily for 1 day, THEN 2 tablets (8 mg total) daily for 1 day, THEN 1 tablet (4 mg total) daily for 1 day., Disp: 21 tablet, Rfl: 0   Microlet Lancets MISC, Check blood sugar twice daily. Dx E11.8, Disp: 100 each, Rfl: 2    Travoprost, BAK Free, (TRAVATAN Z) 0.004 % SOLN ophthalmic solution, PLACE 1 DROP INTO BOTH EYES AT BEDTIME., Disp: 2.5 mL, Rfl: 1   Travoprost, BAK Free, (TRAVATAN Z) 0.004 % SOLN ophthalmic solution, Place 1 drop into both eyes every evening as directed., Disp: 15 mL, Rfl: 4   Travoprost, BAK Free, (TRAVATAN Z) 0.004 % SOLN ophthalmic solution, Place 1 drop into both eyes every evening as directed., Disp: 15 mL, Rfl: 4   metFORMIN (GLUCOPHAGE) 500 MG tablet, Take 1 tablet (500 mg total) by mouth 2 (two) times daily with a meal., Disp: 180 tablet, Rfl: 1   Objective:     Vitals:   07/07/23 1543  Weight: 167 lb (75.8 kg)  Height: 5\' 8"  (1.727 m)      Body mass index is 25.39 kg/m.    Physical Exam:    General:  awake, alert oriented, no acute distress nontoxic Skin: no suspicious lesions or rashes Neuro:sensation intact and strength 5/5 with no deficits, no atrophy, normal muscle tone Psych: No signs of anxiety, depression or other mood disorder   Right knee: Moderate swelling No deformity Positive fluid wave, joint milking ROM Flex 110, Ext 0 TTP medial and lateral joint line, quad tendon, medial and lateral femoral condyle NTTP over the   patella, plica, patella tendon, tibial tuberostiy, fibular head, posterior fossa, pes anserine bursa, gerdy's tubercle  Neg anterior and posterior drawer Neg lachman Neg sag sign Negative varus stress Negative valgus stress Positive McMurray for pain, negative for palpable pop Gait normal     Electronically signed by:  Ronald Massey D.[facility] 4:02 PM 07/07/23

## 2023-07-10 ENCOUNTER — Ambulatory Visit: Payer: Medicare Other | Admitting: Nurse Practitioner

## 2023-07-13 NOTE — Progress Notes (Deleted)
Aleen Sells D.[facility] [address] [place] [address] Phone: [phone]   Assessment and Plan:     There are no diagnoses linked to this encounter.  ***   Pertinent previous records reviewed include ***   Follow Up: ***     Subjective:   I, Teresha Hanks, am serving as a Neurosurgeon for Doctor Richardean Sale   Chief Complaint: left knee pain    HPI:    07/04/2022 Patient is a 67 year old male complaining of left knee pain. Patient states that he is becoming , he is muslim so when he prays it hurts or when he force flexes , hasnt been hurting for that long    06/17/2023 Patient states that he now has right knee pain . Pain is quad. Has pain when he is flexed. No meds for the pain. No numbness or tingling. He walks about 40 miles    07/07/2023 Patient states right knee is very bad   07/14/2023 Patient states   Relevant Historical Information: DM type II, hypertension  Additional pertinent review of systems negative.   Current Outpatient Medications:    amLODipine (NORVASC) 5 MG tablet, Take 1 tablet (5 mg total) by mouth daily. FOR BLOOD PRESSURE, Disp: 90 tablet, Rfl: 1   apixaban (ELIQUIS) 5 MG TABS tablet, Take 1 tablet (5 mg total) by mouth 2 (two) times daily. Start taking after completion of starter pack., Disp: 60 tablet, Rfl: 1   APIXABAN (ELIQUIS) VTE STARTER PACK (10MG  AND 5MG ), Take as directed on package: start with two-5mg  tablets twice daily for 7 days. On day 8, switch to one-5mg  tablet twice daily., Disp: 74 each, Rfl: 0   atorvastatin (LIPITOR) 20 MG tablet, Take 1 tablet (20 mg total) by mouth daily., Disp: 90 tablet, Rfl: 1   benzonatate (TESSALON) 100 MG capsule, Take 1 capsule (100 mg total) by mouth 2 (two) times daily as needed for cough., Disp: 20 capsule, Rfl: 0   Blood Glucose Monitoring Suppl (CONTOUR NEXT EZ) w/Device KIT, USE AS DIRECTED TWICE DAILY, Disp: 1 kit, Rfl: 0   chlorthalidone (HYGROTON) 25  MG tablet, Take 1 tablet (25 mg total) by mouth daily., Disp: 90 tablet, Rfl: 1   Dulaglutide (TRULICITY) 0.75 MG/0.5ML SOPN, Inject 0.75 mg into the skin once a week. Please fill as a 90 day supply, Disp: 6 mL, Rfl: 3   loratadine (CLARITIN) 10 MG tablet, Take 1 tablet (10 mg total) by mouth daily., Disp: 90 tablet, Rfl: 3   metFORMIN (GLUCOPHAGE) 500 MG tablet, Take 1 tablet (500 mg total) by mouth 2 (two) times daily with a meal., Disp: 180 tablet, Rfl: 1   methylPREDNISolone (MEDROL DOSEPAK) 4 MG TBPK tablet, Take 6 tablets (24 mg total) by mouth daily for 1 day, THEN 5 tablets (20 mg total) daily for 1 day, THEN 4 tablets (16 mg total) daily for 1 day, THEN 3 tablets (12 mg total) daily for 1 day, THEN 2 tablets (8 mg total) daily for 1 day, THEN 1 tablet (4 mg total) daily for 1 day., Disp: 21 tablet, Rfl: 0   Microlet Lancets MISC, Check blood sugar twice daily. Dx E11.8, Disp: 100 each, Rfl: 2   Travoprost, BAK Free, (TRAVATAN Z) 0.004 % SOLN ophthalmic solution, PLACE 1 DROP INTO BOTH EYES AT BEDTIME., Disp: 2.5 mL, Rfl: 1   Travoprost, BAK Free, (TRAVATAN Z) 0.004 % SOLN ophthalmic solution, Place 1 drop into both eyes every evening as  directed., Disp: 15 mL, Rfl: 4   Travoprost, BAK Free, (TRAVATAN Z) 0.004 % SOLN ophthalmic solution, Place 1 drop into both eyes every evening as directed., Disp: 15 mL, Rfl: 4   Objective:     There were no vitals filed for this visit.    There is no height or weight on file to calculate BMI.    Physical Exam:    ***   Electronically signed by:  Aleen Sells D.[facility] 4:38 PM 07/13/23

## 2023-07-14 ENCOUNTER — Ambulatory Visit: Payer: Medicare Other | Admitting: Sports Medicine

## 2023-07-15 ENCOUNTER — Ambulatory Visit: Payer: Medicare Other | Admitting: Sports Medicine

## 2023-07-27 ENCOUNTER — Other Ambulatory Visit: Payer: Self-pay | Admitting: Family Medicine

## 2023-07-27 DIAGNOSIS — E118 Type 2 diabetes mellitus with unspecified complications: Secondary | ICD-10-CM

## 2023-07-28 ENCOUNTER — Other Ambulatory Visit: Payer: Self-pay

## 2023-07-28 NOTE — Telephone Encounter (Signed)
Requested Prescriptions  Pending Prescriptions Disp Refills   Blood Glucose Monitoring Suppl (CONTOUR NEXT EZ) w/Device KIT [Pharmacy Med Name: CONTOUR NEXT EZ     MIS] 1 kit 0    Sig: USE AS DIRECTED TWICE DAILY     Endocrinology: Diabetes - Testing Supplies Passed - 07/27/2023 10:32 AM      Passed - Valid encounter within last 12 months    Recent Outpatient Visits           2 months ago Type 2 diabetes mellitus with hyperlipidemia [facility])   [place] [facility] [place] Blue B, MD   5 months ago Type 2 diabetes mellitus with complication, without long-term current use of insulin [facility])   [facility] [place], [place] W, NP   10 months ago Type 2 diabetes mellitus with complication, without long-term current use of insulin [facility])   [place] [facility] [place], Shea Stakes, NP   1 year ago Essential hypertension   [facility] [place], Shea Stakes, NP   1 year ago Essential hypertension   [place] [facility] [place], Shea Stakes, NP

## 2023-08-25 ENCOUNTER — Ambulatory Visit (HOSPITAL_COMMUNITY): Payer: Medicare Other

## 2023-08-28 ENCOUNTER — Other Ambulatory Visit: Payer: Self-pay | Admitting: Sports Medicine

## 2023-08-28 ENCOUNTER — Other Ambulatory Visit: Payer: Self-pay

## 2023-09-01 ENCOUNTER — Other Ambulatory Visit: Payer: Self-pay

## 2023-09-03 ENCOUNTER — Other Ambulatory Visit: Payer: Self-pay

## 2023-09-03 MED ORDER — LATANOPROST 0.005 % OP SOLN
1.0000 [drp] | Freq: Every evening | OPHTHALMIC | 3 refills | Status: AC
  Filled 2023-09-03: qty 2.5, 25d supply, fill #0
  Filled 2023-11-18: qty 7.5, 75d supply, fill #0
  Filled 2024-05-03: qty 5, 50d supply, fill #1
  Filled 2024-06-16: qty 5, 50d supply, fill #2
  Filled 2024-08-01: qty 5, 50d supply, fill #3

## 2023-09-15 ENCOUNTER — Other Ambulatory Visit: Payer: Self-pay

## 2023-09-18 ENCOUNTER — Ambulatory Visit: Payer: Medicare Other | Attending: Nurse Practitioner | Admitting: Nurse Practitioner

## 2023-09-18 ENCOUNTER — Encounter: Payer: Self-pay | Admitting: Nurse Practitioner

## 2023-09-18 ENCOUNTER — Other Ambulatory Visit: Payer: Self-pay

## 2023-09-18 VITALS — BP 138/81 | HR 80 | Ht 68.0 in | Wt 170.8 lb

## 2023-09-18 DIAGNOSIS — Z1211 Encounter for screening for malignant neoplasm of colon: Secondary | ICD-10-CM

## 2023-09-18 DIAGNOSIS — E1169 Type 2 diabetes mellitus with other specified complication: Secondary | ICD-10-CM | POA: Diagnosis not present

## 2023-09-18 DIAGNOSIS — M25561 Pain in right knee: Secondary | ICD-10-CM | POA: Diagnosis not present

## 2023-09-18 DIAGNOSIS — R7989 Other specified abnormal findings of blood chemistry: Secondary | ICD-10-CM | POA: Diagnosis not present

## 2023-09-18 DIAGNOSIS — E119 Type 2 diabetes mellitus without complications: Secondary | ICD-10-CM | POA: Diagnosis not present

## 2023-09-18 DIAGNOSIS — Z7984 Long term (current) use of oral hypoglycemic drugs: Secondary | ICD-10-CM

## 2023-09-18 DIAGNOSIS — I10 Essential (primary) hypertension: Secondary | ICD-10-CM

## 2023-09-18 DIAGNOSIS — G8929 Other chronic pain: Secondary | ICD-10-CM

## 2023-09-18 DIAGNOSIS — E785 Hyperlipidemia, unspecified: Secondary | ICD-10-CM | POA: Diagnosis not present

## 2023-09-18 LAB — POCT GLYCOSYLATED HEMOGLOBIN (HGB A1C): HbA1c, POC (controlled diabetic range): 6.7 % (ref 0.0–7.0)

## 2023-09-18 MED ORDER — PREDNISONE 10 MG PO TABS
ORAL_TABLET | ORAL | 0 refills | Status: DC
  Filled 2023-09-18: qty 21, 6d supply, fill #0

## 2023-09-18 MED ORDER — TRULICITY 0.75 MG/0.5ML ~~LOC~~ SOAJ
0.7500 mg | SUBCUTANEOUS | 3 refills | Status: DC
  Filled 2023-09-18 – 2023-10-23 (×2): qty 6, 84d supply, fill #0
  Filled 2024-02-26 – 2024-03-03 (×2): qty 6, 84d supply, fill #1

## 2023-09-18 NOTE — Progress Notes (Signed)
Assessment & Plan:  Ronald Massey was seen today for medical management of chronic issues.  Diagnoses and all orders for this visit:  Diabetes mellitus treated with oral medication (HCC) -     POCT glycosylated hemoglobin (Hb A1C) -     Ambulatory referral to Ophthalmology -     Dulaglutide (TRULICITY) 0.75 MG/0.5ML SOAJ; Inject 0.75 mg into the skin once a week. -     CMP14+EGFR  Colon cancer screening -     Ambulatory referral to Gastroenterology  Primary hypertension  Chronic pain of right knee -     predniSONE (DELTASONE) 10 MG tablet; Directions for 6 day taper: Day 1: 2 tablets before breakfast, 1 after both lunch & dinner and 2 at bedtime Day 2: 1 tab before breakfast, 1 after both lunch & dinner and 2 at bedtime Day 3: 1 tab at each meal & 1 at bedtime Day 4: 1 tab at breakfast, 1 at lunch, 1 at bedtime Day 5: 1 tab at breakfast & 1 tab at bedtime Day 6: 1 tab at breakfast  Abnormal CBC -     CBC with Differential    Patient has been counseled on age-appropriate routine health concerns for screening and prevention. These are reviewed and up-to-date. Referrals have been placed accordingly. Immunizations are up-to-date or declined.    Subjective:   Chief Complaint  Patient presents with   Medical Management of Chronic Issues    Ronald Massey 67 y.o. male presents to office today for follow-up to diabetes and hypertension.  He also endorses decreased frequency of bowel movements with no abdominal pain, nausea or vomiting and is currently taking Trulicity which is likely causing slow transit constipation.   HTN Blood pressure improved to near goal. He is prescribed chlorthalidone 25 mg daily and amlodipine 5 mg daily however it does not appear either of these medications have been filled over the past several months.  BP Readings from Last 3 Encounters:  09/18/23 138/81  06/25/23 (!) 143/70  05/25/23 139/78   / DM 2 Diabetes is well-controlled and A1c is now down to 6.7.   He is taking metformin 500 mg twice daily and Trulicity 0.75 mg weekly as prescribed.   Lab Results  Component Value Date   HGBA1C 6.7 09/18/2023     Chronic right knee pain He is currently seeing orthopedic sports medicine for his knee pain.  Was given a tapering dose of prednisone in September which she notes significant improvement of his knee pain.  He received the Zilretta injection and today notes fleeting improvement of pain after injection was given in September.  Review of Systems  Constitutional:  Negative for fever, malaise/fatigue and weight loss.  HENT: Negative.  Negative for nosebleeds.   Eyes: Negative.  Negative for blurred vision, double vision and photophobia.  Respiratory: Negative.  Negative for cough and shortness of breath.   Cardiovascular: Negative.  Negative for chest pain, palpitations and leg swelling.  Gastrointestinal:  Positive for constipation. Negative for heartburn, nausea and vomiting.  Musculoskeletal:  Positive for joint pain. Negative for myalgias.  Neurological: Negative.  Negative for dizziness, focal weakness, seizures and headaches.  Psychiatric/Behavioral: Negative.  Negative for suicidal ideas.     Past Medical History:  Diagnosis Date   Dental cavities 12/25/2014   Diabetes mellitus without complication (HCC)    Hyperlipidemia    Hypertension    Onychomycosis of toenail 03/04/2016   Pollen allergies    Tinea pedis of both feet 03/04/2016  Past Surgical History:  Procedure Laterality Date   EYE SURGERY      Family History  Problem Relation Age of Onset   Hypertension Mother     Social History Reviewed with no changes to be made today.   Outpatient Medications Prior to Visit  Medication Sig Dispense Refill   amLODipine (NORVASC) 5 MG tablet Take 1 tablet (5 mg total) by mouth daily. FOR BLOOD PRESSURE 90 tablet 1   apixaban (ELIQUIS) 5 MG TABS tablet Take 1 tablet (5 mg total) by mouth 2 (two) times daily. Start taking after  completion of starter pack. 60 tablet 1   APIXABAN (ELIQUIS) VTE STARTER PACK (10MG  AND 5MG ) Take as directed on package: start with two-5mg  tablets twice daily for 7 days. On day 8, switch to one-5mg  tablet twice daily. 74 each 0   atorvastatin (LIPITOR) 20 MG tablet Take 1 tablet (20 mg total) by mouth daily. 90 tablet 1   benzonatate (TESSALON) 100 MG capsule Take 1 capsule (100 mg total) by mouth 2 (two) times daily as needed for cough. 20 capsule 0   Blood Glucose Monitoring Suppl (CONTOUR NEXT EZ) w/Device KIT USE AS DIRECTED TWICE DAILY 1 kit 0   chlorthalidone (HYGROTON) 25 MG tablet Take 1 tablet (25 mg total) by mouth daily. 90 tablet 1   latanoprost (XALATAN) 0.005 % ophthalmic solution Place 1 drop into both eyes every evening as directed. 22.5 mL 3   loratadine (CLARITIN) 10 MG tablet Take 1 tablet (10 mg total) by mouth daily. 90 tablet 3   metFORMIN (GLUCOPHAGE) 500 MG tablet Take 1 tablet (500 mg total) by mouth 2 (two) times daily with a meal. 180 tablet 1   Microlet Lancets MISC Check blood sugar twice daily. Dx E11.8 100 each 2   Travoprost, BAK Free, (TRAVATAN Z) 0.004 % SOLN ophthalmic solution PLACE 1 DROP INTO BOTH EYES AT BEDTIME. 2.5 mL 1   Travoprost, BAK Free, (TRAVATAN Z) 0.004 % SOLN ophthalmic solution Place 1 drop into both eyes every evening as directed. 15 mL 4   Travoprost, BAK Free, (TRAVATAN Z) 0.004 % SOLN ophthalmic solution Place 1 drop into both eyes every evening as directed. 15 mL 4   Dulaglutide (TRULICITY) 0.75 MG/0.5ML SOAJ Inject 0.75 mg into the skin once a week. 6 mL 3   No facility-administered medications prior to visit.    No Known Allergies     Objective:    BP 138/81 (BP Location: Left Arm, Patient Position: Sitting, Cuff Size: Normal)   Pulse 80   Ht 5\' 8"  (1.727 m)   Wt 170 lb 12.8 oz (77.5 kg)   SpO2 100%   BMI 25.97 kg/m  Wt Readings from Last 3 Encounters:  09/18/23 170 lb 12.8 oz (77.5 kg)  07/07/23 167 lb (75.8 kg)  06/17/23  170 lb (77.1 kg)    Physical Exam Vitals and nursing note reviewed.  Constitutional:      Appearance: He is well-developed.  HENT:     Head: Normocephalic and atraumatic.  Cardiovascular:     Rate and Rhythm: Normal rate and regular rhythm.     Heart sounds: Normal heart sounds. No murmur heard.    No friction rub. No gallop.  Pulmonary:     Effort: Pulmonary effort is normal. No tachypnea or respiratory distress.     Breath sounds: Normal breath sounds. No decreased breath sounds, wheezing, rhonchi or rales.  Chest:     Chest wall: No tenderness.  Abdominal:  General: Bowel sounds are normal.     Palpations: Abdomen is soft.  Musculoskeletal:        General: Normal range of motion.     Cervical back: Normal range of motion.  Skin:    General: Skin is warm and dry.  Neurological:     Mental Status: He is alert and oriented to person, place, and time.     Coordination: Coordination normal.  Psychiatric:        Behavior: Behavior normal. Behavior is cooperative.        Thought Content: Thought content normal.        Judgment: Judgment normal.          Patient has been counseled extensively about nutrition and exercise as well as the importance of adherence with medications and regular follow-up. The patient was given clear instructions to go to ER or return to medical center if symptoms don't improve, worsen or new problems develop. The patient verbalized understanding.   Follow-up: Return in about 3 months (around 12/19/2023).   Claiborne Rigg, FNP-BC [facility] [place], [place] [phone]   09/18/2023, 10:53 PM

## 2023-09-19 LAB — CBC WITH DIFFERENTIAL/PLATELET
Basophils Absolute: 0 10*3/uL (ref 0.0–0.2)
Basos: 0 %
EOS (ABSOLUTE): 0.2 10*3/uL (ref 0.0–0.4)
Eos: 3 %
Hematocrit: 44.9 % (ref 37.5–51.0)
Hemoglobin: 14.7 g/dL (ref 13.0–17.7)
Immature Grans (Abs): 0 10*3/uL (ref 0.0–0.1)
Immature Granulocytes: 0 %
Lymphocytes Absolute: 2.4 10*3/uL (ref 0.7–3.1)
Lymphs: 47 %
MCH: 30.6 pg (ref 26.6–33.0)
MCHC: 32.7 g/dL (ref 31.5–35.7)
MCV: 93 fL (ref 79–97)
Monocytes Absolute: 0.4 10*3/uL (ref 0.1–0.9)
Monocytes: 7 %
Neutrophils Absolute: 2.2 10*3/uL (ref 1.4–7.0)
Neutrophils: 43 %
Platelets: 219 10*3/uL (ref 150–450)
RBC: 4.81 x10E6/uL (ref 4.14–5.80)
RDW: 13.2 % (ref 11.6–15.4)
WBC: 5.2 10*3/uL (ref 3.4–10.8)

## 2023-09-19 LAB — CMP14+EGFR
ALT: 24 [IU]/L (ref 0–44)
AST: 19 [IU]/L (ref 0–40)
Albumin: 4.4 g/dL (ref 3.9–4.9)
Alkaline Phosphatase: 71 [IU]/L (ref 44–121)
BUN/Creatinine Ratio: 16 (ref 10–24)
BUN: 17 mg/dL (ref 8–27)
Bilirubin Total: 0.2 mg/dL (ref 0.0–1.2)
CO2: 27 mmol/L (ref 20–29)
Calcium: 9.7 mg/dL (ref 8.6–10.2)
Chloride: 102 mmol/L (ref 96–106)
Creatinine, Ser: 1.07 mg/dL (ref 0.76–1.27)
Globulin, Total: 2.8 g/dL (ref 1.5–4.5)
Glucose: 121 mg/dL — ABNORMAL HIGH (ref 70–99)
Potassium: 4.6 mmol/L (ref 3.5–5.2)
Sodium: 142 mmol/L (ref 134–144)
Total Protein: 7.2 g/dL (ref 6.0–8.5)
eGFR: 77 mL/min/{1.73_m2} (ref >59)

## 2023-10-09 DIAGNOSIS — H26491 Other secondary cataract, right eye: Secondary | ICD-10-CM | POA: Diagnosis not present

## 2023-10-18 ENCOUNTER — Encounter: Payer: Self-pay | Admitting: Nurse Practitioner

## 2023-10-22 DIAGNOSIS — H401131 Primary open-angle glaucoma, bilateral, mild stage: Secondary | ICD-10-CM | POA: Diagnosis not present

## 2023-10-22 DIAGNOSIS — H26491 Other secondary cataract, right eye: Secondary | ICD-10-CM | POA: Diagnosis not present

## 2023-10-22 DIAGNOSIS — E119 Type 2 diabetes mellitus without complications: Secondary | ICD-10-CM | POA: Diagnosis not present

## 2023-10-22 DIAGNOSIS — Z961 Presence of intraocular lens: Secondary | ICD-10-CM | POA: Diagnosis not present

## 2023-10-23 ENCOUNTER — Other Ambulatory Visit: Payer: Self-pay

## 2023-10-23 ENCOUNTER — Other Ambulatory Visit: Payer: Self-pay | Admitting: Student-PharmD

## 2023-11-02 ENCOUNTER — Other Ambulatory Visit: Payer: Self-pay

## 2023-11-17 ENCOUNTER — Other Ambulatory Visit: Payer: Self-pay | Admitting: Nurse Practitioner

## 2023-11-17 DIAGNOSIS — E1169 Type 2 diabetes mellitus with other specified complication: Secondary | ICD-10-CM

## 2023-11-17 NOTE — Telephone Encounter (Signed)
 Medication Refill -  Most Recent Primary Care Visit:  Provider: THEOTIS OHM W  Department: [facility]  Visit Type: OFFICE VISIT  Date: 09/18/2023  Medication: Travoprost , BAK Free, (TRAVATAN  Z) 0.004 % SOLN ophthalmic solution [[id number]] metFORMIN  (GLUCOPHAGE ) 500 MG tablet [[id number]]   Has the patient contacted their pharmacy? Yes  (Agent: If yes, when and what did the pharmacy advise?) Contact PCP   Is this the correct pharmacy for this prescription? Yes  This is the patient's preferred pharmacy:  [facility] [address] [place] [address] Phone: [phone] Fax: [phone]    Has the prescription been filled recently? No  Is the patient out of the medication? Yes  Has the patient been seen for an appointment in the last year OR does the patient have an upcoming appointment? Yes  Can we respond through MyChart? No  Agent: Please be advised that Rx refills may take up to 3 business days. We ask that you follow-up with your pharmacy.

## 2023-11-18 ENCOUNTER — Other Ambulatory Visit: Payer: Self-pay

## 2023-11-18 ENCOUNTER — Other Ambulatory Visit: Payer: Self-pay | Admitting: Nurse Practitioner

## 2023-11-18 DIAGNOSIS — E1169 Type 2 diabetes mellitus with other specified complication: Secondary | ICD-10-CM

## 2023-11-18 MED ORDER — METFORMIN HCL 500 MG PO TABS
500.0000 mg | ORAL_TABLET | Freq: Two times a day (BID) | ORAL | 0 refills | Status: DC
  Filled 2023-11-18: qty 180, 90d supply, fill #0

## 2023-11-18 NOTE — Telephone Encounter (Signed)
 Requested Prescriptions  Pending Prescriptions Disp Refills   metFORMIN  (GLUCOPHAGE ) 500 MG tablet 180 tablet 0    Sig: Take 1 tablet (500 mg total) by mouth 2 (two) times daily with a meal.     Endocrinology:  Diabetes - Biguanides Failed - 11/18/2023  1:55 PM      Failed - B12 Level in normal range and within 720 days    No results found for: "VITAMINB12"       Passed - Cr in normal range and within 360 days    Creat  Date Value Ref Range Status  05/26/2016 1.00 0.70 - 1.33 mg/dL Final    Comment:      For patients > or = 67 years of age: The upper reference limit for Creatinine is approximately 13% higher for people identified as African-American.      Creatinine, Ser  Date Value Ref Range Status  09/18/2023 1.07 0.76 - 1.27 mg/dL Final         Passed - HBA1C is between 0 and 7.9 and within 180 days    HbA1c, POC (controlled diabetic range)  Date Value Ref Range Status  09/18/2023 6.7 0.0 - 7.0 % Final         Passed - eGFR in normal range and within 360 days    GFR, Est African American  Date Value Ref Range Status  05/26/2016 >89 >=60 mL/min Final   GFR calc Af Amer  Date Value Ref Range Status  08/01/2020 92 >59 mL/min/1.73 Final    Comment:    **[facility] currently reports eGFR in compliance with the current**   recommendations of the [employer]. [facility] will   update reporting as new guidelines are published from the NKF-ASN   Task force.    GFR, Est Non African American  Date Value Ref Range Status  05/26/2016 82 >=60 mL/min Final   GFR calc non Af Amer  Date Value Ref Range Status  08/01/2020 80 >59 mL/min/1.73 Final   GFR  Date Value Ref Range Status  02/27/2022 90.99 >60.00 mL/min Final    Comment:    Calculated using the CKD-EPI Creatinine Equation (2021)   eGFR  Date Value Ref Range Status  09/18/2023 77 >59 mL/min/1.73 Final         Passed - Valid encounter within last 6 months    Recent Outpatient Visits           2  months ago Diabetes mellitus treated with oral medication (HCC)   [facility] Dept Of [place]. [facility] [place], [place] W, NP   5 months ago Type 2 diabetes mellitus with hyperlipidemia [facility])   [facility] - [facility] Dept Of [place]. [facility] Concetta Dee B, MD   9 months ago Type 2 diabetes mellitus with complication, without long-term current use of insulin (HCC)   [facility] - A Dept Of [place]. [facility] [place], [place] W, NP   1 year ago Type 2 diabetes mellitus with complication, without long-term current use of insulin (HCC)   [facility] - A Dept Of [place]. [facility] [place] Dean, NP   1 year ago Essential hypertension   [facility] - A Dept Of [place]. [facility] [place], Maile Score, NP              Passed - CBC within  normal limits and completed in the last 12 months    WBC  Date Value Ref Range Status  09/18/2023 5.2 3.4 - 10.8 x10E3/uL Final  05/26/2016 4.2 3.8 - 10.8 K/uL Final   RBC  Date Value Ref Range Status  09/18/2023 4.81 4.14 - 5.80 x10E6/uL Final  05/26/2016 4.62 4.20 - 5.80 MIL/uL Final   Hemoglobin  Date Value Ref Range Status  09/18/2023 14.7 13.0 - 17.7 g/dL Final   Hematocrit  Date Value Ref Range Status  09/18/2023 44.9 37.5 - 51.0 % Final   MCHC  Date Value Ref Range Status  09/18/2023 32.7 31.5 - 35.7 g/dL Final  98/09/9146 82.9 32.0 - 36.0 g/dL Final   [facility]  Date Value Ref Range Status  09/18/2023 30.6 26.6 - 33.0 pg Final  05/26/2016 30.5 27.0 - 33.0 pg Final   MCV  Date Value Ref Range Status  09/18/2023 93 79 - 97 fL Final   No results found for: "PLTCOUNTKUC", "LABPLAT", "POCPLA" RDW  Date Value Ref Range Status  09/18/2023 13.2 11.6 - 15.4 % Final

## 2023-11-18 NOTE — Telephone Encounter (Signed)
 Requested medication (s) are due for refill today: Yes  Requested medication (s) are on the active medication list: Yes  Last refill:  06/10/23  Future visit scheduled: No  Notes to clinic:  Unable to refill per protocol, Rx expired. (Metformin )     Requested Prescriptions  Pending Prescriptions Disp Refills   Travoprost , BAK Free, (TRAVATAN  Z) 0.004 % SOLN ophthalmic solution 15 mL 4    Sig: Place 1 drop into both eyes every evening as directed.     Ophthalmology:  Glaucoma Passed - 11/18/2023 10:42 AM      Passed - Valid encounter within last 12 months    Recent Outpatient Visits           2 months ago Diabetes mellitus treated with oral medication (HCC)   [facility] Of [place]. [facility] [place], [place] W, NP   5 months ago Type 2 diabetes mellitus with hyperlipidemia [facility])   [facility] - A Dept Of [place]. [facility] Concetta Dee B, MD   9 months ago Type 2 diabetes mellitus with complication, without long-term current use of insulin (HCC)   [facility] - A Dept Of [place]. [facility] [place], [place] W, NP   1 year ago Type 2 diabetes mellitus with complication, without long-term current use of insulin (HCC)   [facility] - A Dept Of [place]. [facility] [place] Dean, NP   1 year ago Essential hypertension   [facility] - A Dept Of [place]. [facility] [place], Zelda W, NP               metFORMIN  (GLUCOPHAGE ) 500 MG tablet 180 tablet 1    Sig: Take 1 tablet (500 mg total) by mouth 2 (two) times daily with a meal.     Endocrinology:  Diabetes - Biguanides Failed - 11/18/2023 10:42 AM      Failed - B12 Level in normal range and within 720 days    No results found for: "VITAMINB12"       Passed - Cr in normal range and within 360 days    Creat  Date Value Ref Range Status  05/26/2016 1.00 0.70 -  1.33 mg/dL Final    Comment:      For patients > or = 67 years of age: The upper reference limit for Creatinine is approximately 13% higher for people identified as African-American.      Creatinine, Ser  Date Value Ref Range Status  09/18/2023 1.07 0.76 - 1.27 mg/dL Final         Passed - HBA1C is between 0 and 7.9 and within 180 days    HbA1c, POC (controlled diabetic range)  Date Value Ref Range Status  09/18/2023 6.7 0.0 - 7.0 % Final         Passed - eGFR in normal range and within 360 days    GFR, Est African American  Date Value Ref Range Status  05/26/2016 >89 >=60 mL/min Final   GFR calc Af Amer  Date Value Ref Range Status  08/01/2020 92 >59 mL/min/1.73 Final    Comment:    **[facility] currently reports eGFR in compliance with the current**   recommendations of the [employer]. [facility] will   update reporting as new guidelines are published from the NKF-ASN   Task force.    GFR,  Est Non African American  Date Value Ref Range Status  05/26/2016 82 >=60 mL/min Final   GFR calc non Af Amer  Date Value Ref Range Status  08/01/2020 80 >59 mL/min/1.73 Final   GFR  Date Value Ref Range Status  02/27/2022 90.99 >60.00 mL/min Final    Comment:    Calculated using the CKD-EPI Creatinine Equation (2021)   eGFR  Date Value Ref Range Status  09/18/2023 77 >59 mL/min/1.73 Final         Passed - Valid encounter within last 6 months    Recent Outpatient Visits           2 months ago Diabetes mellitus treated with oral medication (HCC)   [facility] Dept Of [place]. [facility] [place], [place] W, NP   5 months ago Type 2 diabetes mellitus with hyperlipidemia [facility])   [facility] [facility] - A Dept Of [place]. [facility] Concetta Dee B, MD   9 months ago Type 2 diabetes mellitus with complication, without long-term current use of insulin (HCC)   [facility] - A Dept Of  [place]. [facility] [place], [place] W, NP   1 year ago Type 2 diabetes mellitus with complication, without long-term current use of insulin (HCC)   [place] [facility] [facility] - A Dept Of [place]. [facility] [place] Dean, NP   1 year ago Essential hypertension   [facility] [place] - A Dept Of [place]. [facility] [place], [place] W, NP              Passed - CBC within normal limits and completed in the last 12 months    WBC  Date Value Ref Range Status  09/18/2023 5.2 3.4 - 10.8 x10E3/uL Final  05/26/2016 4.2 3.8 - 10.8 K/uL Final   RBC  Date Value Ref Range Status  09/18/2023 4.81 4.14 - 5.80 x10E6/uL Final  05/26/2016 4.62 4.20 - 5.80 MIL/uL Final   Hemoglobin  Date Value Ref Range Status  09/18/2023 14.7 13.0 - 17.7 g/dL Final   Hematocrit  Date Value Ref Range Status  09/18/2023 44.9 37.5 - 51.0 % Final   MCHC  Date Value Ref Range Status  09/18/2023 32.7 31.5 - 35.7 g/dL Final  16/08/9603 54.0 32.0 - 36.0 g/dL Final   [facility]  Date Value Ref Range Status  09/18/2023 30.6 26.6 - 33.0 pg Final  05/26/2016 30.5 27.0 - 33.0 pg Final   MCV  Date Value Ref Range Status  09/18/2023 93 79 - 97 fL Final   No results found for: "PLTCOUNTKUC", "LABPLAT", "POCPLA" RDW  Date Value Ref Range Status  09/18/2023 13.2 11.6 - 15.4 % Final

## 2023-11-26 ENCOUNTER — Encounter: Payer: Self-pay | Admitting: Nurse Practitioner

## 2023-12-28 ENCOUNTER — Encounter: Payer: Self-pay | Admitting: Nurse Practitioner

## 2024-01-21 ENCOUNTER — Telehealth: Payer: Self-pay | Admitting: Internal Medicine

## 2024-01-21 NOTE — Telephone Encounter (Signed)
 Called to confirm appt. Pt will be present

## 2024-01-26 ENCOUNTER — Ambulatory Visit: Payer: 59 | Attending: Nurse Practitioner

## 2024-02-08 ENCOUNTER — Ambulatory Visit: Admitting: Nurse Practitioner

## 2024-02-17 ENCOUNTER — Ambulatory Visit: Admitting: Nurse Practitioner

## 2024-02-22 DIAGNOSIS — H26491 Other secondary cataract, right eye: Secondary | ICD-10-CM | POA: Diagnosis not present

## 2024-02-22 DIAGNOSIS — E119 Type 2 diabetes mellitus without complications: Secondary | ICD-10-CM | POA: Diagnosis not present

## 2024-02-22 DIAGNOSIS — H401131 Primary open-angle glaucoma, bilateral, mild stage: Secondary | ICD-10-CM | POA: Diagnosis not present

## 2024-02-22 DIAGNOSIS — Z961 Presence of intraocular lens: Secondary | ICD-10-CM | POA: Diagnosis not present

## 2024-02-26 ENCOUNTER — Other Ambulatory Visit: Payer: Self-pay

## 2024-02-29 ENCOUNTER — Other Ambulatory Visit: Payer: Self-pay

## 2024-03-03 ENCOUNTER — Other Ambulatory Visit: Payer: Self-pay

## 2024-03-03 MED ORDER — CYCLOSPORINE 0.05 % OP EMUL
1.0000 [drp] | Freq: Two times a day (BID) | OPHTHALMIC | 6 refills | Status: AC
  Filled 2024-03-03 – 2024-05-03 (×2): qty 60, 30d supply, fill #0

## 2024-03-08 ENCOUNTER — Ambulatory Visit: Admitting: Nurse Practitioner

## 2024-03-08 ENCOUNTER — Other Ambulatory Visit: Payer: Self-pay

## 2024-03-09 ENCOUNTER — Other Ambulatory Visit: Payer: Self-pay

## 2024-03-14 ENCOUNTER — Other Ambulatory Visit: Payer: Self-pay

## 2024-03-14 ENCOUNTER — Ambulatory Visit: Payer: Self-pay

## 2024-03-14 NOTE — Telephone Encounter (Signed)
 Copied from CRM [id number]. Topic: Clinical - Red Word Triage >> Mar 14, 2024  3:22 PM Oddis Bench wrote: Red Word that prompted transfer to Nurse Triage: Patient is having bad ear ache.   Chief Complaint: Ear pain Symptoms: Right ear pain Frequency: Constant  Pertinent Negatives: Patient denies fever, cough, runny nose Disposition: [] ED /[x] Urgent Care (no appt availability in office) / [] Appointment(In office/virtual)/ []  [facility]/ [] Home Care/ [] Refused Recommended Disposition /[] [place] [facility]/ []  Follow-up with PCP Additional Notes: Patient reports he has been experiencing right sided ear pain for the last 3-4 days. He states his pain is mild to moderate and is constant. He denies any fever, cough, or runny nose. I advised the patient that there are no appointments available and that he should go to urgent care for evaluation and treatment of his ear pain. Patient verbalized understanding and agreement of this plan.    Reason for Disposition  Earache  (Exceptions: brief ear pain of < 60 minutes duration, earache occurring during air travel  Answer Assessment - Initial Assessment Questions 1. LOCATION: "Which ear is involved?"     Right ear 2. ONSET: "When did the ear start hurting"      3-4 days  3. SEVERITY: "How bad is the pain?"  (Scale 1-10; mild, moderate or severe)   - MILD (1-3): doesn't interfere with normal activities    - MODERATE (4-7): interferes with normal activities or awakens from sleep    - SEVERE (8-10): excruciating pain, unable to do any normal activities      Mild to moderate  4. URI SYMPTOMS: "Do you have a runny nose or cough?"     No 5. FEVER: "Do you have a fever?" If Yes, ask: "What is your temperature, how was it measured, and when did it start?"     No 6. CAUSE: "Have you been swimming recently?", "How often do you use Q-TIPS?", "Have you had any recent air travel or scuba diving?"     Unsure  7. OTHER SYMPTOMS: "Do you have any  other symptoms?" (e.g., headache, stiff neck, dizziness, vomiting, runny nose, decreased hearing)     No  Protocols used: Earache-A-AH

## 2024-03-16 ENCOUNTER — Other Ambulatory Visit: Payer: Self-pay

## 2024-03-16 DIAGNOSIS — H9011 Conductive hearing loss, unilateral, right ear, with unrestricted hearing on the contralateral side: Secondary | ICD-10-CM | POA: Diagnosis not present

## 2024-03-16 DIAGNOSIS — H6121 Impacted cerumen, right ear: Secondary | ICD-10-CM | POA: Diagnosis not present

## 2024-03-16 MED ORDER — OFLOXACIN 0.3 % OT SOLN
4.0000 [drp] | Freq: Two times a day (BID) | OTIC | 1 refills | Status: AC
  Filled 2024-03-16: qty 5, 13d supply, fill #0
  Filled 2024-05-03: qty 5, 13d supply, fill #1

## 2024-03-23 DIAGNOSIS — H26491 Other secondary cataract, right eye: Secondary | ICD-10-CM | POA: Diagnosis not present

## 2024-03-23 DIAGNOSIS — E119 Type 2 diabetes mellitus without complications: Secondary | ICD-10-CM | POA: Diagnosis not present

## 2024-03-23 DIAGNOSIS — H401131 Primary open-angle glaucoma, bilateral, mild stage: Secondary | ICD-10-CM | POA: Diagnosis not present

## 2024-03-23 DIAGNOSIS — Z961 Presence of intraocular lens: Secondary | ICD-10-CM | POA: Diagnosis not present

## 2024-03-30 ENCOUNTER — Ambulatory Visit: Attending: Nurse Practitioner | Admitting: Nurse Practitioner

## 2024-03-30 ENCOUNTER — Encounter: Payer: Self-pay | Admitting: Nurse Practitioner

## 2024-03-30 ENCOUNTER — Other Ambulatory Visit: Payer: Self-pay

## 2024-03-30 VITALS — BP 154/73 | HR 70 | Resp 18 | Ht 68.0 in | Wt 168.8 lb

## 2024-03-30 DIAGNOSIS — J069 Acute upper respiratory infection, unspecified: Secondary | ICD-10-CM

## 2024-03-30 DIAGNOSIS — E1159 Type 2 diabetes mellitus with other circulatory complications: Secondary | ICD-10-CM

## 2024-03-30 DIAGNOSIS — E119 Type 2 diabetes mellitus without complications: Secondary | ICD-10-CM | POA: Diagnosis not present

## 2024-03-30 DIAGNOSIS — I10 Essential (primary) hypertension: Secondary | ICD-10-CM | POA: Diagnosis not present

## 2024-03-30 DIAGNOSIS — Z7985 Long-term (current) use of injectable non-insulin antidiabetic drugs: Secondary | ICD-10-CM

## 2024-03-30 DIAGNOSIS — E785 Hyperlipidemia, unspecified: Secondary | ICD-10-CM | POA: Diagnosis not present

## 2024-03-30 DIAGNOSIS — J3089 Other allergic rhinitis: Secondary | ICD-10-CM | POA: Diagnosis not present

## 2024-03-30 DIAGNOSIS — E1169 Type 2 diabetes mellitus with other specified complication: Secondary | ICD-10-CM

## 2024-03-30 DIAGNOSIS — R0989 Other specified symptoms and signs involving the circulatory and respiratory systems: Secondary | ICD-10-CM | POA: Diagnosis not present

## 2024-03-30 DIAGNOSIS — R059 Cough, unspecified: Secondary | ICD-10-CM | POA: Diagnosis not present

## 2024-03-30 LAB — POCT GLYCOSYLATED HEMOGLOBIN (HGB A1C): HbA1c, POC (controlled diabetic range): 6.3 % (ref 0.0–7.0)

## 2024-03-30 LAB — POCT RAPID STREP A (OFFICE): Rapid Strep A Screen: NEGATIVE

## 2024-03-30 LAB — POC COVID19/FLU A&B COMBO
Covid Antigen, POC: NEGATIVE
Influenza A Antigen, POC: NEGATIVE
Influenza B Antigen, POC: NEGATIVE

## 2024-03-30 MED ORDER — ATORVASTATIN CALCIUM 20 MG PO TABS
20.0000 mg | ORAL_TABLET | Freq: Every day | ORAL | 1 refills | Status: DC
  Filled 2024-03-30 – 2024-05-03 (×2): qty 90, 90d supply, fill #0
  Filled 2024-07-29: qty 90, 90d supply, fill #1

## 2024-03-30 MED ORDER — TRULICITY 0.75 MG/0.5ML ~~LOC~~ SOAJ
0.7500 mg | SUBCUTANEOUS | 3 refills | Status: AC
  Filled 2024-03-30 – 2024-07-29 (×4): qty 2, 28d supply, fill #0
  Filled 2024-11-30 (×2): qty 6, 84d supply, fill #0

## 2024-03-30 MED ORDER — CHLORTHALIDONE 25 MG PO TABS
25.0000 mg | ORAL_TABLET | Freq: Every day | ORAL | 1 refills | Status: AC
  Filled 2024-03-30: qty 90, 90d supply, fill #0

## 2024-03-30 MED ORDER — DEXTROMETHORPHAN-GUAIFENESIN 10-100 MG/5ML PO LIQD
10.0000 mL | ORAL | 0 refills | Status: DC | PRN
  Filled 2024-03-30: qty 300, 5d supply, fill #0

## 2024-03-30 MED ORDER — AMLODIPINE BESYLATE 5 MG PO TABS
5.0000 mg | ORAL_TABLET | Freq: Every day | ORAL | 1 refills | Status: DC
  Filled 2024-03-30 – 2024-05-03 (×2): qty 90, 90d supply, fill #0
  Filled 2024-07-29: qty 90, 90d supply, fill #1

## 2024-03-30 MED ORDER — METFORMIN HCL 500 MG PO TABS
500.0000 mg | ORAL_TABLET | Freq: Two times a day (BID) | ORAL | 0 refills | Status: DC
  Filled 2024-03-30 – 2024-05-03 (×2): qty 180, 90d supply, fill #0

## 2024-03-30 MED ORDER — LORATADINE 10 MG PO TABS
10.0000 mg | ORAL_TABLET | Freq: Every day | ORAL | 3 refills | Status: DC
  Filled 2024-03-30: qty 90, 90d supply, fill #0

## 2024-03-30 NOTE — Patient Instructions (Signed)
 Ronald Massey [address] [place], [place] [address] ?PH# [phone] ?

## 2024-03-30 NOTE — Addendum Note (Signed)
 Addended by: Roberts Ching on: 03/30/2024 04:05 PM   Modules accepted: Orders

## 2024-03-30 NOTE — Progress Notes (Signed)
 Assessment & Plan:   [name] was seen today for diabetes.  Diagnoses and all orders for this visit:  Diabetes mellitus treated with injections of non-insulin medication (HCC) -     POCT glycosylated hemoglobin (Hb A1C) -     CMP14+EGFR -     Microalbumin / creatinine urine ratio -     Dulaglutide  (TRULICITY ) 0.75 MG/0.5ML SOAJ; Inject 0.75 mg into the skin once a week Continue blood sugar control as discussed in office today, low carbohydrate diet, and regular physical exercise as tolerated, 150 minutes per week (30 min each day, 5 days per week, or 50 min 3 days per week). Keep blood sugar logs with fasting goal of 90-130 mg/dl, post prandial (after you eat) less than 180.  For Hypoglycemia: BS <60 and Hyperglycemia BS >400; contact the clinic ASAP. Annual eye exams and foot exams are recommended. .  Hyperlipidemia associated with type 2 diabetes mellitus (HCC) -     Lipid panel -     atorvastatin  (LIPITOR) 20 MG tablet; Take 1 tablet (20 mg total) by mouth daily. -     metFORMIN  (GLUCOPHAGE ) 500 MG tablet; Take 1 tablet (500 mg total) by mouth 2 (two) times daily with a meal. INSTRUCTIONS: Work on a low fat, heart healthy diet and participate in regular aerobic exercise program by working out at least 150 minutes per week; 5 days a week-30 minutes per day. Avoid red meat/beef/steak,  fried foods. junk foods, sodas, sugary drinks, unhealthy snacking, alcohol and smoking.  Drink at least 80 oz of water per day and monitor your carbohydrate intake daily.    Primary hypertension -     chlorthalidone  (HYGROTON ) 25 MG tablet; Take 1 tablet (25 mg total) by mouth daily. -     amLODipine  (NORVASC ) 5 MG tablet; Take 1 tablet (5 mg total) by mouth daily. FOR BLOOD PRESSURE Continue all antihypertensives as prescribed.  Reminded to bring in blood pressure log for follow  up appointment.  RECOMMENDATIONS: DASH/Mediterranean Diets are healthier choices for HTN.    Environmental and seasonal  allergies -     loratadine  (CLARITIN ) 10 MG tablet; Take 1 tablet (10 mg total) by mouth daily.  URI with cough and congestion -     CBC with Differential -     dextromethorphan-guaiFENesin  (TUSSIN DM) 10-100 MG/5ML liquid; Take 10 mLs by mouth every 4 (four) hours as needed for cough.    Patient has been counseled on age-appropriate routine health concerns for screening and prevention. These are reviewed and up-to-date. Referrals have been placed accordingly. Immunizations are up-to-date or declined.    Subjective:   Chief Complaint  Patient presents with   Diabetes    Ronald Massey 68 y.o. male presents to office today for follow up to DM and HTN  He has a past medical history of Dental cavities (12/25/2014), DM 2, Hyperlipidemia, Hypertension, Onychomycosis of toenail (03/04/2016), Pollen allergies, and Tinea pedis of both feet (03/04/2016).     HTN He has not been taking his blood pressure as prescribed. Currently taking amlodipine  5 mg as needed and not daily. Not taking chlorthalidone . BP Readings from Last 3 Encounters:  03/30/24 (!) 154/76  09/18/23 138/81  06/25/23 (!) 143/70     URI For the past  2 days he has been experiencing sore throat, coughing, runny nose. STREP AND COVID NEGATIVE here in the office today.    He was trying to kick a football a year ago and felt a muscle pull  in his right upper thigh. There is a palpable mass there in the thigh that does not produce any pain.    DM 2 A1c at goal today. He continues to use metformin  and Trulicity  Lab Results  Component Value Date   HGBA1C 6.3 03/30/2024     Review of Systems  Constitutional:  Positive for fever and malaise/fatigue. Negative for weight loss.  HENT:  Positive for congestion and sore throat. Negative for nosebleeds.   Eyes: Negative.  Negative for blurred vision, double vision and photophobia.  Respiratory:  Positive for cough. Negative for shortness of breath.   Cardiovascular: Negative.   Negative for chest pain, palpitations and leg swelling.  Gastrointestinal: Negative.  Negative for heartburn, nausea and vomiting.  Musculoskeletal: Negative.  Negative for myalgias.  Neurological: Negative.  Negative for dizziness, focal weakness, seizures and headaches.  Endo/Heme/Allergies:  Positive for environmental allergies.  Psychiatric/Behavioral: Negative.  Negative for suicidal ideas.     Past Medical History:  Diagnosis Date   Dental cavities 12/25/2014   Diabetes mellitus without complication (HCC)    Hyperlipidemia    Hypertension    Onychomycosis of toenail 03/04/2016   Pollen allergies    Tinea pedis of both feet 03/04/2016    Past Surgical History:  Procedure Laterality Date   EYE SURGERY      Family History  Problem Relation Age of Onset   Hypertension Mother     Social History Reviewed with no changes to be made today.   Outpatient Medications Prior to Visit  Medication Sig Dispense Refill   cycloSPORINE  (RESTASIS ) 0.05 % ophthalmic emulsion Place 1 drop into both eyes 2 (two) times daily. 60 each 6   latanoprost  (XALATAN ) 0.005 % ophthalmic solution Place 1 drop into both eyes every evening as directed. 22.5 mL 3   Travoprost , BAK Free, (TRAVATAN  Z) 0.004 % SOLN ophthalmic solution Place 1 drop into both eyes every evening as directed. 15 mL 4   amLODipine  (NORVASC ) 5 MG tablet Take 1 tablet (5 mg total) by mouth daily. FOR BLOOD PRESSURE 90 tablet 1   atorvastatin  (LIPITOR) 20 MG tablet Take 1 tablet (20 mg total) by mouth daily. 90 tablet 1   chlorthalidone  (HYGROTON ) 25 MG tablet Take 1 tablet (25 mg total) by mouth daily. 90 tablet 1   loratadine  (CLARITIN ) 10 MG tablet Take 1 tablet (10 mg total) by mouth daily. 90 tablet 3   metFORMIN  (GLUCOPHAGE ) 500 MG tablet Take 1 tablet (500 mg total) by mouth 2 (two) times daily with a meal. 180 tablet 0   Blood Glucose Monitoring Suppl (CONTOUR NEXT EZ) w/Device KIT USE AS DIRECTED TWICE DAILY (Patient not taking:  Reported on 03/30/2024) 1 kit 0   Microlet Lancets MISC Check blood sugar twice daily. Dx E11.8 (Patient not taking: Reported on 03/30/2024) 100 each 2   apixaban  (ELIQUIS ) 5 MG TABS tablet Take 1 tablet (5 mg total) by mouth 2 (two) times daily. Start taking after completion of starter pack. 60 tablet 1   APIXABAN  (ELIQUIS ) VTE STARTER PACK (10MG  AND 5MG ) Take as directed on package: start with two-5mg  tablets twice daily for 7 days. On day 8, switch to one-5mg  tablet twice daily. 74 each 0   benzonatate  (TESSALON ) 100 MG capsule Take 1 capsule (100 mg total) by mouth 2 (two) times daily as needed for cough. 20 capsule 0   Dulaglutide  (TRULICITY ) 0.75 MG/0.5ML SOAJ Inject 0.75 mg into the skin once a week. (Patient not taking: Reported on 03/30/2024) 6  mL 3   predniSONE  (DELTASONE ) 10 MG tablet Directions for 6 day taper: Day 1: 2 tablets before breakfast, 1 after both lunch & dinner and 2 at bedtime Day 2: 1 tab before breakfast, 1 after both lunch & dinner and 2 at bedtime Day 3: 1 tab at each meal & 1 at bedtime Day 4: 1 tab at breakfast, 1 at lunch, 1 at bedtime Day 5: 1 tab at breakfast & 1 tab at bedtime Day 6: 1 tab at breakfast 21 tablet 0   Travoprost , BAK Free, (TRAVATAN  Z) 0.004 % SOLN ophthalmic solution PLACE 1 DROP INTO BOTH EYES AT BEDTIME. 2.5 mL 1   Travoprost , BAK Free, (TRAVATAN  Z) 0.004 % SOLN ophthalmic solution Place 1 drop into both eyes every evening as directed. 15 mL 4   No facility-administered medications prior to visit.    No Known Allergies     Objective:    BP (!) 154/76 (BP Location: Left Arm, Patient Position: Sitting, Cuff Size: Normal)   Pulse 82   Resp 18   Ht 5\' 8"  (1.727 m)   Wt 168 lb 12.8 oz (76.6 kg)   SpO2 99%   BMI 25.67 kg/m  Wt Readings from Last 3 Encounters:  03/30/24 168 lb 12.8 oz (76.6 kg)  09/18/23 170 lb 12.8 oz (77.5 kg)  07/07/23 167 lb (75.8 kg)    Physical Exam Vitals and nursing note reviewed.  Constitutional:      Appearance:  He is well-developed.  HENT:     Head: Normocephalic and atraumatic.  Cardiovascular:     Rate and Rhythm: Normal rate and regular rhythm.     Heart sounds: Normal heart sounds. No murmur heard.    No friction rub. No gallop.  Pulmonary:     Effort: Pulmonary effort is normal. No tachypnea or respiratory distress.     Breath sounds: Normal breath sounds. No decreased breath sounds, wheezing, rhonchi or rales.  Chest:     Chest wall: No tenderness.  Abdominal:     General: Bowel sounds are normal.     Palpations: Abdomen is soft.  Musculoskeletal:        General: Normal range of motion.     Cervical back: Normal range of motion.       Legs:  Skin:    General: Skin is warm and dry.  Neurological:     Mental Status: He is alert and oriented to person, place, and time.     Coordination: Coordination normal.  Psychiatric:        Behavior: Behavior normal. Behavior is cooperative.        Thought Content: Thought content normal.        Judgment: Judgment normal.          Patient has been counseled extensively about nutrition and exercise as well as the importance of adherence with medications and regular follow-up. The patient was given clear instructions to go to ER or return to medical center if symptoms don't improve, worsen or new problems develop. The patient verbalized understanding.   Follow-up: Return in about 3 months (around 06/30/2024).   Collins Dean, FNP-BC [facility] [place], [place] [phone]   03/30/2024, 4:00 PM

## 2024-03-31 LAB — CBC WITH DIFFERENTIAL/PLATELET
Basophils Absolute: 0 10*3/uL (ref 0.0–0.2)
Basos: 0 %
EOS (ABSOLUTE): 0.2 10*3/uL (ref 0.0–0.4)
Eos: 3 %
Hematocrit: 43.2 % (ref 37.5–51.0)
Hemoglobin: 14.2 g/dL (ref 13.0–17.7)
Immature Grans (Abs): 0 10*3/uL (ref 0.0–0.1)
Immature Granulocytes: 0 %
Lymphocytes Absolute: 2.4 10*3/uL (ref 0.7–3.1)
Lymphs: 35 %
MCH: 31.2 pg (ref 26.6–33.0)
MCHC: 32.9 g/dL (ref 31.5–35.7)
MCV: 95 fL (ref 79–97)
Monocytes Absolute: 0.6 10*3/uL (ref 0.1–0.9)
Monocytes: 9 %
Neutrophils Absolute: 3.6 10*3/uL (ref 1.4–7.0)
Neutrophils: 53 %
Platelets: 196 10*3/uL (ref 150–450)
RBC: 4.55 x10E6/uL (ref 4.14–5.80)
RDW: 13.2 % (ref 11.6–15.4)
WBC: 6.8 10*3/uL (ref 3.4–10.8)

## 2024-03-31 LAB — CMP14+EGFR
ALT: 20 IU/L (ref 0–44)
AST: 18 IU/L (ref 0–40)
Albumin: 4.2 g/dL (ref 3.9–4.9)
Alkaline Phosphatase: 77 IU/L (ref 44–121)
BUN/Creatinine Ratio: 13 (ref 10–24)
BUN: 12 mg/dL (ref 8–27)
Bilirubin Total: 0.3 mg/dL (ref 0.0–1.2)
CO2: 23 mmol/L (ref 20–29)
Calcium: 9.3 mg/dL (ref 8.6–10.2)
Chloride: 101 mmol/L (ref 96–106)
Creatinine, Ser: 0.93 mg/dL (ref 0.76–1.27)
Globulin, Total: 2.7 g/dL (ref 1.5–4.5)
Glucose: 195 mg/dL — ABNORMAL HIGH (ref 70–99)
Potassium: 4.4 mmol/L (ref 3.5–5.2)
Sodium: 139 mmol/L (ref 134–144)
Total Protein: 6.9 g/dL (ref 6.0–8.5)
eGFR: 90 mL/min/{1.73_m2} (ref >59)

## 2024-03-31 LAB — LIPID PANEL
Chol/HDL Ratio: 4.1 ratio (ref 0.0–5.0)
Cholesterol, Total: 173 mg/dL (ref 100–199)
HDL: 42 mg/dL (ref >39)
LDL Chol Calc (NIH): 100 mg/dL — ABNORMAL HIGH (ref 0–99)
Triglycerides: 176 mg/dL — ABNORMAL HIGH (ref 0–149)
VLDL Cholesterol Cal: 31 mg/dL (ref 5–40)

## 2024-04-01 LAB — MICROALBUMIN / CREATININE URINE RATIO
Creatinine, Urine: 108.7 mg/dL
Microalb/Creat Ratio: 12 mg/g{creat} (ref 0–29)
Microalbumin, Urine: 12.7 ug/mL

## 2024-04-03 ENCOUNTER — Ambulatory Visit: Payer: Self-pay | Admitting: Nurse Practitioner

## 2024-04-11 ENCOUNTER — Other Ambulatory Visit: Payer: Self-pay

## 2024-04-11 NOTE — Telephone Encounter (Addendum)
 FYI Copied from CRM [id number]. Topic: Clinical - Lab/Test Results >> Apr 11, 2024  3:06 PM Opal Bill wrote:  Reason for CRM: Patient results have been relayed. No further questions at this time.

## 2024-04-27 DIAGNOSIS — H401131 Primary open-angle glaucoma, bilateral, mild stage: Secondary | ICD-10-CM | POA: Diagnosis not present

## 2024-04-27 DIAGNOSIS — H26491 Other secondary cataract, right eye: Secondary | ICD-10-CM | POA: Diagnosis not present

## 2024-04-27 DIAGNOSIS — Z961 Presence of intraocular lens: Secondary | ICD-10-CM | POA: Diagnosis not present

## 2024-04-27 DIAGNOSIS — E119 Type 2 diabetes mellitus without complications: Secondary | ICD-10-CM | POA: Diagnosis not present

## 2024-05-03 ENCOUNTER — Other Ambulatory Visit: Payer: Self-pay

## 2024-06-16 ENCOUNTER — Other Ambulatory Visit: Payer: Self-pay

## 2024-06-16 ENCOUNTER — Telehealth: Payer: Self-pay

## 2024-06-16 NOTE — Telephone Encounter (Signed)
 Copied from CRM [id number]. Topic: Clinical - Prescription Issue >> Jun 16, 2024 12:52 PM Nathanel BROCKS wrote: Reason for CRM:    Dulaglutide  (TRULICITY ) 0.75 MG/0.5ML SOAJ  Ins called with pt on the phone and is needing a prior authorization for this medication before it can be filled. Please advise.  ----------------------------------------------------------------------- From previous Reason for Contact - Prescription Issue: Reason for CRM: Dulaglutide  (TRULICITY ) 0.75 MG/0.5ML SOAJ Ins called with Pt on the phon

## 2024-06-20 ENCOUNTER — Other Ambulatory Visit: Payer: Self-pay

## 2024-06-20 NOTE — Telephone Encounter (Signed)
 Noted

## 2024-06-20 NOTE — Telephone Encounter (Signed)
 Thank you so much

## 2024-06-24 ENCOUNTER — Other Ambulatory Visit: Payer: Self-pay

## 2024-07-05 ENCOUNTER — Ambulatory Visit: Admitting: Nurse Practitioner

## 2024-07-11 ENCOUNTER — Encounter: Payer: Self-pay | Admitting: Nurse Practitioner

## 2024-07-11 ENCOUNTER — Other Ambulatory Visit (HOSPITAL_COMMUNITY): Payer: Self-pay

## 2024-07-11 ENCOUNTER — Ambulatory Visit: Attending: Nurse Practitioner | Admitting: Nurse Practitioner

## 2024-07-11 ENCOUNTER — Other Ambulatory Visit: Payer: Self-pay

## 2024-07-11 VITALS — BP 133/75 | HR 73 | Resp 19 | Ht 68.0 in | Wt 167.4 lb

## 2024-07-11 DIAGNOSIS — I10 Essential (primary) hypertension: Secondary | ICD-10-CM | POA: Diagnosis not present

## 2024-07-11 DIAGNOSIS — E119 Type 2 diabetes mellitus without complications: Secondary | ICD-10-CM

## 2024-07-11 DIAGNOSIS — E1169 Type 2 diabetes mellitus with other specified complication: Secondary | ICD-10-CM | POA: Diagnosis not present

## 2024-07-11 DIAGNOSIS — J3089 Other allergic rhinitis: Secondary | ICD-10-CM

## 2024-07-11 DIAGNOSIS — G8929 Other chronic pain: Secondary | ICD-10-CM

## 2024-07-11 DIAGNOSIS — Z79899 Other long term (current) drug therapy: Secondary | ICD-10-CM

## 2024-07-11 DIAGNOSIS — M25562 Pain in left knee: Secondary | ICD-10-CM

## 2024-07-11 DIAGNOSIS — E785 Hyperlipidemia, unspecified: Secondary | ICD-10-CM

## 2024-07-11 DIAGNOSIS — Z7984 Long term (current) use of oral hypoglycemic drugs: Secondary | ICD-10-CM | POA: Diagnosis not present

## 2024-07-11 DIAGNOSIS — Z7985 Long-term (current) use of injectable non-insulin antidiabetic drugs: Secondary | ICD-10-CM | POA: Diagnosis not present

## 2024-07-11 MED ORDER — LEVOCETIRIZINE DIHYDROCHLORIDE 5 MG PO TABS
5.0000 mg | ORAL_TABLET | Freq: Every evening | ORAL | 1 refills | Status: AC
  Filled 2024-07-11 – 2024-07-29 (×2): qty 90, 90d supply, fill #0

## 2024-07-11 MED ORDER — IPRATROPIUM BROMIDE 0.06 % NA SOLN
2.0000 | Freq: Four times a day (QID) | NASAL | 12 refills | Status: AC
  Filled 2024-07-11 – 2024-07-29 (×2): qty 15, 10d supply, fill #0

## 2024-07-11 MED ORDER — METFORMIN HCL 500 MG PO TABS
ORAL_TABLET | ORAL | 3 refills | Status: DC
  Filled 2024-07-11 – 2024-07-29 (×2): qty 180, 60d supply, fill #0
  Filled 2024-09-28: qty 90, 30d supply, fill #1

## 2024-07-11 MED ORDER — DICLOFENAC SODIUM 1 % EX GEL
4.0000 g | Freq: Four times a day (QID) | CUTANEOUS | 1 refills | Status: AC
  Filled 2024-07-11: qty 200, 13d supply, fill #0

## 2024-07-11 NOTE — Progress Notes (Signed)
 Assessment & Plan:  [name] was seen today for hypertension and osteoarthritis.  Diagnoses and all orders for this visit:  Diabetes mellitus treated with injections of non-insulin medication  A1c currently at goal. Insurance will not cover Trulicity  due to high deductible.  He will speak with his insurance company regarding Copy. -     Hemoglobin A1c -     CMP14+EGFR -     metFORMIN  (GLUCOPHAGE ) 500 MG tablet; Take 1 tablet with breakfast and 2 tablets with dinner. FOR DIABETES  Hyperlipidemia associated with type 2 diabetes mellitus (HCC) Continue atorvastatin  20 mg daily INSTRUCTIONS: Work on a low fat, heart healthy diet and participate in regular aerobic exercise program by working out at least 150 minutes per week; 5 days a week-30 minutes per day. Avoid red meat/beef/steak,  fried foods. junk foods, sodas, sugary drinks, unhealthy snacking, alcohol and smoking.  Drink at least 80 oz of water per day and monitor your carbohydrate intake daily.    Environmental and seasonal allergies -     levocetirizine (XYZAL ) 5 MG tablet; Take 1 tablet (5 mg total) by mouth every evening. FOR ALLERGIES -     ipratropium (ATROVENT ) 0.06 % nasal spray; Place 2 sprays into both nostrils 4 (four) times daily.  Chronic pain of left knee Recommend Voltaren  gel to be applied topically Also recommend kneeling pad to be placed under knees when praying   Patient has been counseled on age-appropriate routine health concerns for screening and prevention. These are reviewed and up-to-date. Referrals have been placed accordingly. Immunizations are up-to-date or declined.    Subjective:   Chief Complaint  Patient presents with   Hypertension   Osteoarthritis    Ronald Massey 68 y.o. male presents to office today for follow-up to hypertension and with chronic left knee pain.  HTN Blood pressure is well-controlled with amlodipine  5 mg daily and chlorthalidone  25 mg daily. BP  Readings from Last 3 Encounters:  07/11/24 133/75  03/30/24 (!) 154/73  09/18/23 138/81    He has chronic left knee pain.  Pain is worse when he kneels to prays several times a day.  Follow-up Review of Systems  Constitutional:  Negative for fever, malaise/fatigue and weight loss.  HENT:  Positive for congestion. Negative for nosebleeds.   Eyes: Negative.  Negative for blurred vision, double vision and photophobia.  Respiratory: Negative.  Negative for cough and shortness of breath.   Cardiovascular: Negative.  Negative for chest pain, palpitations and leg swelling.  Gastrointestinal: Negative.  Negative for heartburn, nausea and vomiting.  Musculoskeletal:  Positive for joint pain. Negative for myalgias.  Neurological: Negative.  Negative for dizziness, focal weakness, seizures and headaches.  Endo/Heme/Allergies:  Positive for environmental allergies.  Psychiatric/Behavioral: Negative.  Negative for suicidal ideas.     Past Medical History:  Diagnosis Date   Dental cavities 12/25/2014   Diabetes mellitus without complication (HCC)    Hyperlipidemia    Hypertension    Onychomycosis of toenail 03/04/2016   Pollen allergies    Tinea pedis of both feet 03/04/2016    Past Surgical History:  Procedure Laterality Date   EYE SURGERY      Family History  Problem Relation Age of Onset   Hypertension Mother     Social History Reviewed with no changes to be made today.   Outpatient Medications Prior to Visit  Medication Sig Dispense Refill   amLODipine  (NORVASC ) 5 MG tablet Take 1 tablet (5 mg total) by mouth daily.  FOR BLOOD PRESSURE 90 tablet 1   atorvastatin  (LIPITOR) 20 MG tablet Take 1 tablet (20 mg total) by mouth daily. 90 tablet 1   Blood Glucose Monitoring Suppl (CONTOUR NEXT EZ) w/Device KIT USE AS DIRECTED TWICE DAILY 1 kit 0   chlorthalidone  (HYGROTON ) 25 MG tablet Take 1 tablet (25 mg total) by mouth daily. 90 tablet 1   cycloSPORINE  (RESTASIS ) 0.05 % ophthalmic  emulsion Place 1 drop into both eyes 2 (two) times daily. 60 each 6   dextromethorphan -guaiFENesin  (TUSSIN DM) 10-100 MG/5ML liquid Take 10 mLs by mouth every 4 (four) hours as needed for cough. 300 mL 0   latanoprost  (XALATAN ) 0.005 % ophthalmic solution Place 1 drop into both eyes every evening as directed. 22.5 mL 3   Travoprost , BAK Free, (TRAVATAN  Z) 0.004 % SOLN ophthalmic solution Place 1 drop into both eyes every evening as directed. 15 mL 4   loratadine  (CLARITIN ) 10 MG tablet Take 1 tablet (10 mg total) by mouth daily. 90 tablet 3   metFORMIN  (GLUCOPHAGE ) 500 MG tablet Take 1 tablet (500 mg total) by mouth 2 (two) times daily with a meal. 180 tablet 0   Dulaglutide  (TRULICITY ) 0.75 MG/0.5ML SOAJ Inject 0.75 mg into the skin once a week. (Patient not taking: Reported on 07/11/2024) 6 mL 3   Microlet Lancets MISC Check blood sugar twice daily. Dx E11.8 (Patient not taking: Reported on 07/11/2024) 100 each 2   No facility-administered medications prior to visit.    No Known Allergies     Objective:    BP 133/75 (BP Location: Left Arm, Patient Position: Sitting, Cuff Size: Normal)   Pulse 73   Resp 19   Ht 5' 8 (1.727 m)   Wt 167 lb 6.4 oz (75.9 kg)   SpO2 100%   BMI 25.45 kg/m  Wt Readings from Last 3 Encounters:  07/11/24 167 lb 6.4 oz (75.9 kg)  03/30/24 168 lb 12.8 oz (76.6 kg)  09/18/23 170 lb 12.8 oz (77.5 kg)    Physical Exam Vitals and nursing note reviewed.  Constitutional:      Appearance: He is well-developed.  HENT:     Head: Normocephalic and atraumatic.  Cardiovascular:     Rate and Rhythm: Normal rate and regular rhythm.     Heart sounds: Normal heart sounds. No murmur heard.    No friction rub. No gallop.  Pulmonary:     Effort: Pulmonary effort is normal. No tachypnea or respiratory distress.     Breath sounds: Normal breath sounds. No decreased breath sounds, wheezing, rhonchi or rales.  Chest:     Chest wall: No tenderness.  Musculoskeletal:         General: Normal range of motion.     Cervical back: Normal range of motion.  Skin:    General: Skin is warm and dry.  Neurological:     Mental Status: He is alert and oriented to person, place, and time.     Coordination: Coordination normal.  Psychiatric:        Behavior: Behavior normal. Behavior is cooperative.        Thought Content: Thought content normal.        Judgment: Judgment normal.          Patient has been counseled extensively about nutrition and exercise as well as the importance of adherence with medications and regular follow-up. The patient was given clear instructions to go to ER or return to medical center if symptoms don't improve, worsen or  new problems develop. The patient verbalized understanding.   Follow-up: Return in about 3 months (around 10/14/2024).   Haze LELON Servant, FNP-BC [facility] [place], [place] [phone]   07/11/2024, 11:49 AM

## 2024-07-12 LAB — CMP14+EGFR
ALT: 29 IU/L (ref 0–44)
AST: 21 IU/L (ref 0–40)
Albumin: 4.3 g/dL (ref 3.9–4.9)
Alkaline Phosphatase: 76 IU/L (ref 44–121)
BUN/Creatinine Ratio: 19 (ref 10–24)
BUN: 18 mg/dL (ref 8–27)
Bilirubin Total: 0.3 mg/dL (ref 0.0–1.2)
CO2: 22 mmol/L (ref 20–29)
Calcium: 9.6 mg/dL (ref 8.6–10.2)
Chloride: 100 mmol/L (ref 96–106)
Creatinine, Ser: 0.95 mg/dL (ref 0.76–1.27)
Globulin, Total: 2.6 g/dL (ref 1.5–4.5)
Glucose: 133 mg/dL — ABNORMAL HIGH (ref 70–99)
Potassium: 4.8 mmol/L (ref 3.5–5.2)
Sodium: 135 mmol/L (ref 134–144)
Total Protein: 6.9 g/dL (ref 6.0–8.5)
eGFR: 88 mL/min/1.73 (ref >59)

## 2024-07-12 LAB — HEMOGLOBIN A1C
Est. average glucose Bld gHb Est-mCnc: 174 mg/dL
Hgb A1c MFr Bld: 7.7 % — ABNORMAL HIGH (ref 4.8–5.6)

## 2024-07-19 ENCOUNTER — Other Ambulatory Visit: Payer: Self-pay

## 2024-07-19 ENCOUNTER — Telehealth: Payer: Self-pay

## 2024-07-19 NOTE — Telephone Encounter (Signed)
 Copied from CRM [id number]. Topic: Clinical - Medication Prior Auth >> Jun 23, 2024 10:46 AM Miller H wrote: Reason for CRM: Rx #: [id number]  Dulaglutide  (TRULICITY ) 0.75 MG/0.5ML Ronald Massey [[id number]] needs to be sent for prior authorization//Please advise the patient once received back from insurance

## 2024-07-20 ENCOUNTER — Ambulatory Visit: Payer: Self-pay | Admitting: Nurse Practitioner

## 2024-07-20 ENCOUNTER — Other Ambulatory Visit: Payer: Self-pay

## 2024-07-20 DIAGNOSIS — Z7985 Long-term (current) use of injectable non-insulin antidiabetic drugs: Secondary | ICD-10-CM

## 2024-07-20 MED ORDER — FREESTYLE LIBRE 3 READER DEVI
0 refills | Status: DC
  Filled 2024-07-20: qty 1, 30d supply, fill #0
  Filled 2024-07-29: qty 1, 365d supply, fill #0
  Filled 2024-09-16: qty 1, 360d supply, fill #0

## 2024-07-20 MED ORDER — FREESTYLE LIBRE 3 PLUS SENSOR MISC
6 refills | Status: DC
  Filled 2024-07-20 – 2024-09-16 (×4): qty 2, 30d supply, fill #0

## 2024-07-21 ENCOUNTER — Other Ambulatory Visit: Payer: Self-pay

## 2024-07-21 ENCOUNTER — Telehealth: Payer: Self-pay

## 2024-07-21 NOTE — Telephone Encounter (Signed)
 Pharmacy Patient Advocate Encounter  Received notification from [employer] that Prior Authorization for FREESTYLE LIBRE PLUS SENSOR/READER has been DENIED.  Full denial letter will be uploaded to the media tab. See denial reason below.   PA #/Case ID/Reference #: [id number]  PT IS NOT CURRENTLY BEING TREATED WITH INSULIN.

## 2024-07-21 NOTE — Telephone Encounter (Signed)
 Called patient and information was given, patient stated that he is trying to get new insurance and will talk with his insurance plan about medication. He stated he will to get Trulicity  but if he can't he will let us  know

## 2024-07-26 DIAGNOSIS — H401131 Primary open-angle glaucoma, bilateral, mild stage: Secondary | ICD-10-CM | POA: Diagnosis not present

## 2024-07-26 DIAGNOSIS — E119 Type 2 diabetes mellitus without complications: Secondary | ICD-10-CM | POA: Diagnosis not present

## 2024-07-26 DIAGNOSIS — H26491 Other secondary cataract, right eye: Secondary | ICD-10-CM | POA: Diagnosis not present

## 2024-07-26 DIAGNOSIS — Z961 Presence of intraocular lens: Secondary | ICD-10-CM | POA: Diagnosis not present

## 2024-07-29 ENCOUNTER — Other Ambulatory Visit: Payer: Self-pay

## 2024-08-01 ENCOUNTER — Other Ambulatory Visit: Payer: Self-pay

## 2024-08-02 ENCOUNTER — Other Ambulatory Visit: Payer: Self-pay

## 2024-08-04 ENCOUNTER — Other Ambulatory Visit: Payer: Self-pay | Admitting: Pharmacist

## 2024-08-04 ENCOUNTER — Other Ambulatory Visit: Payer: Self-pay

## 2024-08-04 MED ORDER — ACCU-CHEK GUIDE W/DEVICE KIT
PACK | 0 refills | Status: AC
  Filled 2024-08-04: qty 1, 30d supply, fill #0
  Filled 2024-09-16: qty 1, 1d supply, fill #0

## 2024-08-04 MED ORDER — ACCU-CHEK SOFTCLIX LANCETS MISC
6 refills | Status: AC
  Filled 2024-08-04 – 2024-09-16 (×2): qty 100, 33d supply, fill #0

## 2024-08-04 MED ORDER — ACCU-CHEK GUIDE TEST VI STRP
ORAL_STRIP | 6 refills | Status: AC
  Filled 2024-08-04 – 2024-09-16 (×2): qty 100, 33d supply, fill #0

## 2024-08-08 ENCOUNTER — Other Ambulatory Visit: Payer: Self-pay | Admitting: Pharmacist

## 2024-08-08 NOTE — Progress Notes (Signed)
 Pharmacy Quality Measure Review  This patient is appearing on a report for adherence measure for diabetes medications this calendar year.   Medication: metformin  Last fill date: 07/29/2024 for 60 day supply  Insurance report was not up to date. No action needed at this time.  Next fill date is 09/27/2024. Reminder set for next fill.   Herlene Fleeta Morris, PharmD, JAQUELINE, CPP Clinical Pharmacist [facility] & [facility] [phone]

## 2024-08-11 ENCOUNTER — Other Ambulatory Visit: Payer: Self-pay

## 2024-08-16 ENCOUNTER — Other Ambulatory Visit: Payer: Self-pay

## 2024-09-06 ENCOUNTER — Ambulatory Visit: Attending: Nurse Practitioner | Admitting: Nurse Practitioner

## 2024-09-16 ENCOUNTER — Other Ambulatory Visit: Payer: Self-pay

## 2024-09-19 ENCOUNTER — Other Ambulatory Visit: Payer: Self-pay

## 2024-09-19 MED ORDER — LATANOPROST 0.005 % OP SOLN
1.0000 [drp] | Freq: Every day | OPHTHALMIC | 3 refills | Status: AC
  Filled 2024-09-19 – 2024-11-23 (×2): qty 7.5, 75d supply, fill #0

## 2024-09-27 ENCOUNTER — Other Ambulatory Visit: Payer: Self-pay

## 2024-09-28 ENCOUNTER — Other Ambulatory Visit: Payer: Self-pay | Admitting: Pharmacist

## 2024-09-28 ENCOUNTER — Other Ambulatory Visit: Payer: Self-pay

## 2024-09-28 NOTE — Progress Notes (Signed)
 Pharmacy Quality Measure Review  This patient is appearing on a report for adherence measure for diabetes medications this calendar year.   Medication: metformin  Last fill date: 07/29/2024 for 60 day supply  Collaborated with pharmacy to fill metformin  today.  Called patient and he will pick up today.   Herlene Fleeta Morris, PharmD, JAQUELINE, CPP Clinical Pharmacist [facility] & [facility] [phone]

## 2024-10-03 ENCOUNTER — Other Ambulatory Visit: Payer: Self-pay

## 2024-10-11 ENCOUNTER — Other Ambulatory Visit: Payer: Self-pay

## 2024-10-11 ENCOUNTER — Ambulatory Visit: Attending: Nurse Practitioner | Admitting: Nurse Practitioner

## 2024-10-11 ENCOUNTER — Encounter: Payer: Self-pay | Admitting: Nurse Practitioner

## 2024-10-11 VITALS — BP 152/82 | HR 70 | Resp 19 | Ht 68.0 in | Wt 171.2 lb

## 2024-10-11 DIAGNOSIS — I10 Essential (primary) hypertension: Secondary | ICD-10-CM

## 2024-10-11 DIAGNOSIS — E1169 Type 2 diabetes mellitus with other specified complication: Secondary | ICD-10-CM

## 2024-10-11 LAB — POCT GLYCOSYLATED HEMOGLOBIN (HGB A1C): Hemoglobin A1C: 7.7 % — AB (ref 4.0–5.6)

## 2024-10-11 MED ORDER — ATORVASTATIN CALCIUM 20 MG PO TABS
20.0000 mg | ORAL_TABLET | Freq: Every day | ORAL | 1 refills | Status: AC
  Filled 2024-10-11: qty 90, 90d supply, fill #0

## 2024-10-11 MED ORDER — AMLODIPINE BESYLATE 5 MG PO TABS
5.0000 mg | ORAL_TABLET | Freq: Every day | ORAL | 1 refills | Status: AC
  Filled 2024-10-11: qty 90, 90d supply, fill #0

## 2024-10-11 NOTE — Patient Instructions (Signed)
 [facility] [address] [place], [place] [address] ?PH# [phone] ?

## 2024-10-11 NOTE — Progress Notes (Signed)
 Assessment & Plan:  Ronald Massey was seen today for diabetes.  Diagnoses and all orders for this visit:  Type 2 diabetes mellitus with hyperlipidemia (HCC) -     POCT glycosylated hemoglobin  Continue blood sugar control as discussed in office today, low carbohydrate diet, and regular physical exercise as tolerated, 150 minutes per week (30 min each day, 5 days per week, or 50 min 3 days per week). Keep blood sugar logs with fasting goal of 90-130 mg/dl, post prandial (after you eat) less than 180.  For Hypoglycemia: BS <60 and Hyperglycemia BS >400; contact the clinic ASAP. Annual eye exams and foot exams are recommended.   Primary hypertension -     amLODipine  (NORVASC ) 5 MG tablet; Take 1 tablet (5 mg total) by mouth daily. FOR BLOOD PRESSURE Continue all antihypertensives as prescribed.  Reminded to bring in blood pressure log for follow  up appointment.  RECOMMENDATIONS: DASH/Mediterranean Diets are healthier choices for HTN.    Hyperlipidemia associated with type 2 diabetes mellitus (HCC) -     atorvastatin  (LIPITOR) 20 MG tablet; Take 1 tablet (20 mg total) by mouth daily. INSTRUCTIONS: Work on a low fat, heart healthy diet and participate in regular aerobic exercise program by working out at least 150 minutes per week; 5 days a week-30 minutes per day. Avoid red meat/beef/steak,  fried foods. junk foods, sodas, sugary drinks, unhealthy snacking, alcohol and smoking.  Drink at least 80 oz of water per day and monitor your carbohydrate intake daily.      Patient has been counseled on age-appropriate routine health concerns for screening and prevention. These are reviewed and up-to-date. Referrals have been placed accordingly. Immunizations are up-to-date or declined.    Subjective:   Chief Complaint  Patient presents with   Diabetes    Ronald Massey 68 y.o. male presents to office today for follow-up to diabetes and hypertension.  He has a past medical history of Dental  cavities (12/25/2014), DM 2, Hyperlipidemia, Hypertension, Onychomycosis of toenail (03/04/2016), Pollen allergies, and Tinea pedis of both feet (03/04/2016).     DM 2 No change in A1c currently 7.7.  Today we will increase metformin  to 2 tablets twice daily.Ronald Massey His current insurance will not cover Trulicity  however he will be switching to [facility] in January and states they will cover GLP-1s. They also would not cover a CGM Lab Results  Component Value Date   HGBA1C 7.7 (A) 10/11/2024  LLD not at goal. He is out of his atorvastatin   Lab Results  Component Value Date   LDLCALC 100 (H) 03/30/2024      HTN Blood pressure is elevated today. He is taking amlodipine  5 mg daily and chlorthalidone  25 mg daily as prescribed.  BP Readings from Last 3 Encounters:  10/11/24 (!) 152/82  07/11/24 133/75  03/30/24 (!) 154/73    Review of Systems  Constitutional:  Negative for fever, malaise/fatigue and weight loss.  HENT: Negative.  Negative for nosebleeds.   Eyes: Negative.  Negative for blurred vision, double vision and photophobia.  Respiratory: Negative.  Negative for cough and shortness of breath.   Cardiovascular: Negative.  Negative for chest pain, palpitations and leg swelling.  Gastrointestinal: Negative.  Negative for heartburn, nausea and vomiting.  Musculoskeletal: Negative.  Negative for myalgias.  Neurological: Negative.  Negative for dizziness, focal weakness, seizures and headaches.  Psychiatric/Behavioral: Negative.  Negative for suicidal ideas.     Past Medical History:  Diagnosis Date   Dental cavities 12/25/2014  Diabetes mellitus without complication (HCC)    Hyperlipidemia    Hypertension    Onychomycosis of toenail 03/04/2016   Pollen allergies    Tinea pedis of both feet 03/04/2016    Past Surgical History:  Procedure Laterality Date   EYE SURGERY      Family History  Problem Relation Age of Onset   Hypertension Mother     Social History Reviewed with no changes to  be made today.   Outpatient Medications Prior to Visit  Medication Sig Dispense Refill   Accu-Chek Softclix Lancets lancets Use to check blood sugar 3 times daily. 100 each 6   Blood Glucose Monitoring Suppl (ACCU-CHEK GUIDE) w/Device KIT Use to check blood sugar 3 times daily. 1 kit 0   chlorthalidone  (HYGROTON ) 25 MG tablet Take 1 tablet (25 mg total) by mouth daily. 90 tablet 1   cycloSPORINE  (RESTASIS ) 0.05 % ophthalmic emulsion Place 1 drop into both eyes 2 (two) times daily. 60 each 6   diclofenac  Sodium (VOLTAREN ) 1 % GEL Apply 4 g topically 4 (four) times daily. 200 g 1   glucose blood (ACCU-CHEK GUIDE TEST) test strip Use to check blood sugar 3 times daily. 100 each 6   ipratropium (ATROVENT ) 0.06 % nasal spray Place 2 sprays into both nostrils 4 (four) times daily. 15 mL 12   latanoprost  (XALATAN ) 0.005 % ophthalmic solution Place 1 drop into both eyes every evening as directed. 22.5 mL 3   levocetirizine (XYZAL ) 5 MG tablet Take 1 tablet (5 mg total) by mouth every evening. FOR ALLERGIES 90 tablet 1   metFORMIN  (GLUCOPHAGE ) 500 MG tablet Take 1 tablet (500 mg total) by mouth daily after breakfast AND 2 tablets (1,000 mg total) daily with supper.FOR DIABETES 180 tablet 3   Travoprost , BAK Free, (TRAVATAN  Z) 0.004 % SOLN ophthalmic solution Place 1 drop into both eyes every evening as directed. 15 mL 4   amLODipine  (NORVASC ) 5 MG tablet Take 1 tablet (5 mg total) by mouth daily. FOR BLOOD PRESSURE 90 tablet 1   atorvastatin  (LIPITOR) 20 MG tablet Take 1 tablet (20 mg total) by mouth daily. 90 tablet 1   dextromethorphan -guaiFENesin  (TUSSIN DM) 10-100 MG/5ML liquid Take 10 mLs by mouth every 4 (four) hours as needed for cough. 300 mL 0   Dulaglutide  (TRULICITY ) 0.75 MG/0.5ML SOAJ Inject 0.75 mg into the skin once a week. (Patient not taking: Reported on 10/11/2024) 6 mL 3   latanoprost  (XALATAN ) 0.005 % ophthalmic solution Place 1 drop into both eyes at bedtime. (Patient not taking:  Reported on 10/11/2024) 22.5 mL 3   Continuous Glucose Receiver (FREESTYLE LIBRE 3 READER) DEVI Check blood glucose levels continuously z79.85 (Patient not taking: Reported on 10/11/2024) 1 each 0   Continuous Glucose Sensor (FREESTYLE LIBRE 3 PLUS SENSOR) MISC Change sensor every 15 days. Z79.85 (Patient not taking: Reported on 10/11/2024) 2 each 6   No facility-administered medications prior to visit.    No Known Allergies     Objective:    BP (!) 152/82 (BP Location: Left Arm, Patient Position: Sitting, Cuff Size: Normal)   Pulse 70   Resp 19   Wt 171 lb 3.2 oz (77.7 kg)   SpO2 98%   BMI 26.03 kg/m  Wt Readings from Last 3 Encounters:  10/11/24 171 lb 3.2 oz (77.7 kg)  07/11/24 167 lb 6.4 oz (75.9 kg)  03/30/24 168 lb 12.8 oz (76.6 kg)    Physical Exam Vitals and nursing note reviewed.  Constitutional:  Appearance: He is well-developed.  HENT:     Head: Normocephalic and atraumatic.  Cardiovascular:     Rate and Rhythm: Normal rate and regular rhythm.     Heart sounds: Normal heart sounds. No murmur heard.    No friction rub. No gallop.  Pulmonary:     Effort: Pulmonary effort is normal. No tachypnea or respiratory distress.     Breath sounds: Normal breath sounds. No decreased breath sounds, wheezing, rhonchi or rales.  Chest:     Chest wall: No tenderness.  Abdominal:     General: Bowel sounds are normal.     Palpations: Abdomen is soft.  Musculoskeletal:        General: Normal range of motion.     Cervical back: Normal range of motion.  Skin:    General: Skin is warm and dry.  Neurological:     Mental Status: He is alert and oriented to person, place, and time.     Coordination: Coordination normal.  Psychiatric:        Behavior: Behavior normal. Behavior is cooperative.        Thought Content: Thought content normal.        Judgment: Judgment normal.          Patient has been counseled extensively about nutrition and exercise as well as the  importance of adherence with medications and regular follow-up. The patient was given clear instructions to go to ER or return to medical center if symptoms don't improve, worsen or new problems develop. The patient verbalized understanding.   Follow-up: Return in about 3 months (around 01/13/2025).   Haze LELON Servant, FNP-BC [facility] [place], [place] [phone]   10/11/2024, 12:43 PM

## 2024-11-23 ENCOUNTER — Other Ambulatory Visit: Payer: Self-pay | Admitting: Pharmacist

## 2024-11-23 ENCOUNTER — Other Ambulatory Visit: Payer: Self-pay

## 2024-11-23 DIAGNOSIS — Z7985 Long-term (current) use of injectable non-insulin antidiabetic drugs: Secondary | ICD-10-CM

## 2024-11-23 DIAGNOSIS — E1169 Type 2 diabetes mellitus with other specified complication: Secondary | ICD-10-CM

## 2024-11-23 MED ORDER — METFORMIN HCL 500 MG PO TABS
ORAL_TABLET | ORAL | 2 refills | Status: AC
  Filled 2024-11-23: qty 270, 90d supply, fill #0

## 2024-11-23 NOTE — Progress Notes (Signed)
 Pharmacy Quality Measure Review  This patient is appearing on a report for adherence measure for diabetes medications this calendar year.   Medication: metformin  Last fill date: 10/03/2024 for 30 day supply  Collaborated with pharmacy to fill metformin  today. Rxn resent for a 90-DS. Called patient and he will pick up today.   Herlene Fleeta Morris, PharmD, JAQUELINE, CPP Clinical Pharmacist [facility] [phone]

## 2024-11-30 ENCOUNTER — Other Ambulatory Visit: Payer: Self-pay

## 2024-12-09 ENCOUNTER — Other Ambulatory Visit: Payer: Self-pay

## 2025-01-13 ENCOUNTER — Ambulatory Visit: Admitting: Nurse Practitioner

## 2025-01-16 ENCOUNTER — Ambulatory Visit: Admitting: Nurse Practitioner
